# Patient Record
Sex: Female | Born: 1970 | Race: White | Hispanic: No | Marital: Married | Smoking: Never smoker
Health system: Southern US, Community
[De-identification: ages and names within clinical notes are randomized; demographics above are authoritative.]

## PROBLEM LIST (undated history)

## (undated) DIAGNOSIS — Z87440 Personal history of urinary (tract) infections: Secondary | ICD-10-CM

## (undated) DIAGNOSIS — N3946 Mixed incontinence: Secondary | ICD-10-CM

## (undated) DIAGNOSIS — G43909 Migraine, unspecified, not intractable, without status migrainosus: Secondary | ICD-10-CM

## (undated) DIAGNOSIS — K589 Irritable bowel syndrome without diarrhea: Secondary | ICD-10-CM

## (undated) DIAGNOSIS — Z87442 Personal history of urinary calculi: Secondary | ICD-10-CM

## (undated) DIAGNOSIS — E559 Vitamin D deficiency, unspecified: Secondary | ICD-10-CM

## (undated) DIAGNOSIS — E785 Hyperlipidemia, unspecified: Secondary | ICD-10-CM

## (undated) DIAGNOSIS — I8390 Asymptomatic varicose veins of unspecified lower extremity: Secondary | ICD-10-CM

## (undated) DIAGNOSIS — M1990 Unspecified osteoarthritis, unspecified site: Secondary | ICD-10-CM

## (undated) DIAGNOSIS — N3642 Intrinsic sphincter deficiency (ISD): Secondary | ICD-10-CM

## (undated) DIAGNOSIS — M6752 Plica syndrome, left knee: Secondary | ICD-10-CM

## (undated) DIAGNOSIS — F419 Anxiety disorder, unspecified: Secondary | ICD-10-CM

## (undated) DIAGNOSIS — N3281 Overactive bladder: Secondary | ICD-10-CM

## (undated) DIAGNOSIS — F329 Major depressive disorder, single episode, unspecified: Secondary | ICD-10-CM

## (undated) DIAGNOSIS — E042 Nontoxic multinodular goiter: Secondary | ICD-10-CM

## (undated) HISTORY — PX: AUGMENTATION MAMMAPLASTY: SUR837

## (undated) HISTORY — DX: Asymptomatic varicose veins of unspecified lower extremity: I83.90

## (undated) HISTORY — PX: OTHER SURGICAL HISTORY: SHX169

## (undated) HISTORY — DX: Hyperlipidemia, unspecified: E78.5

## (undated) HISTORY — DX: Anxiety disorder, unspecified: F41.9

## (undated) HISTORY — DX: Irritable bowel syndrome, unspecified: K58.9

## (undated) HISTORY — DX: Vitamin D deficiency, unspecified: E55.9

## (undated) HISTORY — DX: Major depressive disorder, single episode, unspecified: F32.9

## (undated) HISTORY — PX: THYROIDECTOMY: SHX17

## (undated) HISTORY — PX: BREAST ENHANCEMENT SURGERY: SHX7

---

## 1997-06-09 ENCOUNTER — Encounter: Admission: RE | Admit: 1997-06-09 | Discharge: 1997-06-09 | Payer: Self-pay | Admitting: Internal Medicine

## 2000-01-23 ENCOUNTER — Encounter: Payer: Self-pay | Admitting: Emergency Medicine

## 2000-01-23 ENCOUNTER — Emergency Department (HOSPITAL_COMMUNITY): Admission: EM | Admit: 2000-01-23 | Discharge: 2000-01-23 | Payer: Self-pay | Admitting: Emergency Medicine

## 2000-06-05 ENCOUNTER — Encounter: Admission: RE | Admit: 2000-06-05 | Discharge: 2000-06-12 | Payer: Self-pay | Admitting: Neurosurgery

## 2000-10-24 ENCOUNTER — Encounter
Admission: RE | Admit: 2000-10-24 | Discharge: 2000-11-22 | Payer: Self-pay | Admitting: Physical Medicine & Rehabilitation

## 2000-12-24 ENCOUNTER — Emergency Department (HOSPITAL_COMMUNITY): Admission: EM | Admit: 2000-12-24 | Discharge: 2000-12-24 | Payer: Self-pay | Admitting: Internal Medicine

## 2002-09-18 ENCOUNTER — Encounter: Payer: Self-pay | Admitting: Emergency Medicine

## 2002-09-18 ENCOUNTER — Emergency Department (HOSPITAL_COMMUNITY): Admission: EM | Admit: 2002-09-18 | Discharge: 2002-09-18 | Payer: Self-pay | Admitting: Emergency Medicine

## 2003-07-23 ENCOUNTER — Ambulatory Visit (HOSPITAL_COMMUNITY): Admission: RE | Admit: 2003-07-23 | Discharge: 2003-07-23 | Payer: Self-pay | Admitting: Internal Medicine

## 2004-01-08 ENCOUNTER — Emergency Department (HOSPITAL_COMMUNITY): Admission: EM | Admit: 2004-01-08 | Discharge: 2004-01-08 | Payer: Self-pay | Admitting: Emergency Medicine

## 2004-02-09 ENCOUNTER — Emergency Department (HOSPITAL_COMMUNITY): Admission: EM | Admit: 2004-02-09 | Discharge: 2004-02-09 | Payer: Self-pay | Admitting: Emergency Medicine

## 2004-05-12 ENCOUNTER — Ambulatory Visit: Payer: Self-pay | Admitting: Family Medicine

## 2004-07-23 ENCOUNTER — Emergency Department (HOSPITAL_COMMUNITY): Admission: EM | Admit: 2004-07-23 | Discharge: 2004-07-23 | Payer: Self-pay | Admitting: Emergency Medicine

## 2004-11-02 ENCOUNTER — Emergency Department (HOSPITAL_COMMUNITY): Admission: EM | Admit: 2004-11-02 | Discharge: 2004-11-02 | Payer: Self-pay | Admitting: Family Medicine

## 2004-11-02 ENCOUNTER — Ambulatory Visit (HOSPITAL_COMMUNITY): Admission: RE | Admit: 2004-11-02 | Discharge: 2004-11-02 | Payer: Self-pay | Admitting: Family Medicine

## 2004-11-16 ENCOUNTER — Emergency Department (HOSPITAL_COMMUNITY): Admission: EM | Admit: 2004-11-16 | Discharge: 2004-11-16 | Payer: Self-pay | Admitting: Family Medicine

## 2004-12-10 ENCOUNTER — Emergency Department (HOSPITAL_COMMUNITY): Admission: EM | Admit: 2004-12-10 | Discharge: 2004-12-10 | Payer: Self-pay | Admitting: Emergency Medicine

## 2005-07-06 ENCOUNTER — Emergency Department (HOSPITAL_COMMUNITY): Admission: EM | Admit: 2005-07-06 | Discharge: 2005-07-06 | Payer: Self-pay | Admitting: Emergency Medicine

## 2005-07-15 ENCOUNTER — Emergency Department (HOSPITAL_COMMUNITY): Admission: EM | Admit: 2005-07-15 | Discharge: 2005-07-15 | Payer: Self-pay | Admitting: Emergency Medicine

## 2005-07-18 ENCOUNTER — Emergency Department (HOSPITAL_COMMUNITY): Admission: EM | Admit: 2005-07-18 | Discharge: 2005-07-19 | Payer: Self-pay | Admitting: Emergency Medicine

## 2005-12-28 ENCOUNTER — Emergency Department (HOSPITAL_COMMUNITY): Admission: EM | Admit: 2005-12-28 | Discharge: 2005-12-28 | Payer: Self-pay | Admitting: Family Medicine

## 2008-01-25 HISTORY — PX: ABDOMINAL HYSTERECTOMY: SHX81

## 2008-05-07 ENCOUNTER — Emergency Department (HOSPITAL_COMMUNITY): Admission: EM | Admit: 2008-05-07 | Discharge: 2008-05-07 | Payer: Self-pay | Admitting: Emergency Medicine

## 2010-11-24 ENCOUNTER — Emergency Department (HOSPITAL_COMMUNITY)
Admission: EM | Admit: 2010-11-24 | Discharge: 2010-11-24 | Disposition: A | Discharge status: Home / Self Care | Payer: Self-pay | Attending: Emergency Medicine | Admitting: Emergency Medicine

## 2010-11-24 DIAGNOSIS — R110 Nausea: Secondary | ICD-10-CM | POA: Insufficient documentation

## 2010-11-24 DIAGNOSIS — R51 Headache: Secondary | ICD-10-CM | POA: Insufficient documentation

## 2010-11-24 DIAGNOSIS — M129 Arthropathy, unspecified: Secondary | ICD-10-CM | POA: Insufficient documentation

## 2010-11-24 DIAGNOSIS — N390 Urinary tract infection, site not specified: Secondary | ICD-10-CM | POA: Insufficient documentation

## 2010-11-24 DIAGNOSIS — Z79899 Other long term (current) drug therapy: Secondary | ICD-10-CM | POA: Insufficient documentation

## 2010-11-24 DIAGNOSIS — H53149 Visual discomfort, unspecified: Secondary | ICD-10-CM | POA: Insufficient documentation

## 2010-11-24 LAB — POCT PREGNANCY, URINE: Preg Test, Ur: NEGATIVE

## 2010-12-30 ENCOUNTER — Other Ambulatory Visit (HOSPITAL_COMMUNITY): Payer: Self-pay | Admitting: Family Medicine

## 2010-12-30 DIAGNOSIS — Z139 Encounter for screening, unspecified: Secondary | ICD-10-CM

## 2011-01-31 ENCOUNTER — Other Ambulatory Visit (HOSPITAL_COMMUNITY): Payer: Self-pay | Admitting: Family Medicine

## 2011-01-31 ENCOUNTER — Ambulatory Visit (HOSPITAL_COMMUNITY)
Admission: RE | Admit: 2011-01-31 | Discharge: 2011-01-31 | Disposition: A | Discharge status: Home / Self Care | Payer: PRIVATE HEALTH INSURANCE | Source: Ambulatory Visit | Attending: Family Medicine | Admitting: Family Medicine

## 2011-01-31 DIAGNOSIS — Z139 Encounter for screening, unspecified: Secondary | ICD-10-CM

## 2011-01-31 DIAGNOSIS — Z1231 Encounter for screening mammogram for malignant neoplasm of breast: Secondary | ICD-10-CM | POA: Insufficient documentation

## 2012-07-24 ENCOUNTER — Emergency Department (HOSPITAL_BASED_OUTPATIENT_CLINIC_OR_DEPARTMENT_OTHER)
Admission: EM | Admit: 2012-07-24 | Discharge: 2012-07-24 | Disposition: A | Discharge status: Home / Self Care | Payer: BC Managed Care – PPO | Attending: Emergency Medicine | Admitting: Emergency Medicine

## 2012-07-24 ENCOUNTER — Emergency Department (HOSPITAL_BASED_OUTPATIENT_CLINIC_OR_DEPARTMENT_OTHER): Payer: BC Managed Care – PPO

## 2012-07-24 ENCOUNTER — Encounter (HOSPITAL_BASED_OUTPATIENT_CLINIC_OR_DEPARTMENT_OTHER): Payer: Self-pay | Admitting: Family Medicine

## 2012-07-24 DIAGNOSIS — Z7982 Long term (current) use of aspirin: Secondary | ICD-10-CM | POA: Insufficient documentation

## 2012-07-24 DIAGNOSIS — G43909 Migraine, unspecified, not intractable, without status migrainosus: Secondary | ICD-10-CM | POA: Insufficient documentation

## 2012-07-24 DIAGNOSIS — R110 Nausea: Secondary | ICD-10-CM | POA: Insufficient documentation

## 2012-07-24 DIAGNOSIS — Z87440 Personal history of urinary (tract) infections: Secondary | ICD-10-CM | POA: Insufficient documentation

## 2012-07-24 MED ORDER — KETOROLAC TROMETHAMINE 30 MG/ML IJ SOLN
30.0000 mg | Freq: Once | INTRAMUSCULAR | Status: AC
  Administered 2012-07-24: 30 mg via INTRAVENOUS
  Filled 2012-07-24: qty 1

## 2012-07-24 MED ORDER — SODIUM CHLORIDE 0.9 % IV BOLUS (SEPSIS)
1000.0000 mL | Freq: Once | INTRAVENOUS | Status: AC
  Administered 2012-07-24: 1000 mL via INTRAVENOUS

## 2012-07-24 MED ORDER — DIPHENHYDRAMINE HCL 50 MG/ML IJ SOLN
25.0000 mg | Freq: Once | INTRAMUSCULAR | Status: AC
  Administered 2012-07-24: 25 mg via INTRAVENOUS
  Filled 2012-07-24: qty 1

## 2012-07-24 MED ORDER — ONDANSETRON HCL 4 MG/2ML IJ SOLN
4.0000 mg | Freq: Once | INTRAMUSCULAR | Status: AC
  Administered 2012-07-24: 4 mg via INTRAVENOUS
  Filled 2012-07-24: qty 2

## 2012-07-24 MED ORDER — METHYLPREDNISOLONE SODIUM SUCC 125 MG IJ SOLR
125.0000 mg | Freq: Once | INTRAMUSCULAR | Status: AC
  Administered 2012-07-24: 125 mg via INTRAVENOUS
  Filled 2012-07-24: qty 2

## 2012-07-24 NOTE — ED Notes (Signed)
Pt c/o headache x 2 days constant and no relief with excedrin migraine and fiorcet. Pt reports h/o headaches and has not seen neuro for "years".

## 2012-07-24 NOTE — ED Notes (Signed)
MD at bedside. 

## 2012-07-24 NOTE — ED Notes (Signed)
Pt verbalizing ready for dispo, md notified.

## 2012-07-24 NOTE — ED Provider Notes (Signed)
   History    CSN: [id number] Arrival date & time 07/24/12  1042  First MD Initiated Contact with Patient 07/24/12 1140     Chief Complaint  Patient presents with  . Headache   (Consider location/radiation/quality/duration/timing/severity/associated sxs/prior Treatment) HPI  42 y.o. Female with history of migraines presents with headache for a week began gradually.  Pain is in left front of head-left forehead.  Pain is nonradiating and aching.  Pain rated at 10.  Patient has taken two excedrin migraine without relief today.  She has taken ibuprofen, excedrin, hydrocodone last night.  Denies recent head injury, fever, chills, vision changes, no vomiting,numbness, weakness, confusion, sick contacts.   Patient states she has had nausea.  PMD Dr. [address] Past Medical History  Diagnosis Date  . Headache   . Frequent UTI    Past Surgical History  Procedure Laterality Date  . Cesarean section    . Breast implants    . Abdominal hysterectomy     No family history on file. History  Substance Use Topics  . Smoking status: Never Smoker   . Smokeless tobacco: Not on file  . Alcohol Use: No   OB History   Grav Para Term Preterm Abortions TAB SAB Ect Mult Living                 Review of Systems  All other systems reviewed and are negative.    Allergies  Nitrofuran derivatives  Home Medications   Current Outpatient Rx  Name  Route  Sig  Dispense  Refill  . aspirin-acetaminophen-caffeine (EXCEDRIN MIGRAINE) 250-250-65 MG per tablet   Oral   Take 1 tablet by mouth every 6 (six) hours as needed for pain.          BP 99/68  Pulse 86  Temp(Src) 98.3 F (36.8 C) (Oral)  Resp 22  Ht 5\' 4"  (1.626 m)  Wt 172 lb (78.019 kg)  BMI 29.51 kg/m2  SpO2 98% Physical Exam  Nursing note and vitals reviewed. Constitutional: She is oriented to person, place, and time. She appears well-developed and well-nourished.  HENT:  Head: Normocephalic and atraumatic.  Right  Ear: External ear normal.  Left Ear: External ear normal.  Nose: Nose normal.  Mouth/Throat: Oropharynx is clear and moist.  Eyes: Conjunctivae and EOM are normal. Pupils are equal, round, and reactive to light.  Neck: Normal range of motion. Neck supple.  Cardiovascular: Normal rate, regular rhythm, normal heart sounds and intact distal pulses.   Pulmonary/Chest: Effort normal and breath sounds normal.  Abdominal: Soft. Bowel sounds are normal.  Musculoskeletal: Normal range of motion.  Neurological: She is alert and oriented to person, place, and time. She has normal reflexes.  Skin: Skin is warm and dry.  Psychiatric: She has a normal mood and affect. Her behavior is normal. Thought content normal.    ED Course  Procedures (including critical care time) Labs Reviewed - No data to display No results found. No diagnosis found.  MDM  Patient feels improved.    Pt HA treated and improved while in ED.  Presentation is like pts typical HA and non concerning for [facility], I[facility], Meningitis, or temporal arteritis. Pt is afebrile with no focal neuro deficits, nuchal rigidity, or change in vision. Pt is to follow up with PCP to discuss prophylactic medication. Pt verbalizes understanding and is agreeable with plan to dc.    Hilario Quarry, MD 07/24/12 [phone]

## 2012-07-24 NOTE — ED Notes (Signed)
Patient transported to CT via stretcher per tech. 

## 2012-08-22 ENCOUNTER — Ambulatory Visit (INDEPENDENT_AMBULATORY_CARE_PROVIDER_SITE_OTHER): Payer: BC Managed Care – PPO | Admitting: Diagnostic Neuroimaging

## 2012-08-22 ENCOUNTER — Encounter: Payer: Self-pay | Admitting: Diagnostic Neuroimaging

## 2012-08-22 VITALS — BP 101/70 | HR 78 | Ht 64.0 in | Wt 172.0 lb

## 2012-08-22 DIAGNOSIS — G43009 Migraine without aura, not intractable, without status migrainosus: Secondary | ICD-10-CM

## 2012-08-22 MED ORDER — AMITRIPTYLINE HCL 25 MG PO TABS: 25.0000 mg | ORAL_TABLET | Freq: Every day | ORAL | Status: DC

## 2012-08-22 MED ORDER — RIZATRIPTAN BENZOATE 10 MG PO TBDP: 10.0000 mg | ORAL_TABLET | ORAL | Status: DC | PRN

## 2012-08-22 NOTE — Progress Notes (Signed)
[facility]  PATIENT: Dawn Rojas DOB: 04-30-1970  REFERRING CLINICIAN: [facility] / Carlynn Purl HISTORY FROM: patient REASON FOR VISIT: new consult   HISTORICAL  CHIEF COMPLAINT:  Chief Complaint  Patient presents with  . Headache    Np# 6    HISTORY OF PRESENT ILLNESS:   42 year old right-handed female with migraine and anxiety here for valuation of headaches.  In age 24s, patient developed headaches, triggered by alcohol use, with nausea, dizziness, photophobia and phonophobia. Headaches subsided on their own. Since past 3 months she has had increasing headaches. Headaches are right frontal region similar to previous headaches. She was prescribed Imitrex 25 mg and 30 mg without relief. She's having some blurred vision, hearing buzzing sounds with a headaches. She's had 10-12 days of severe headache in the month of July.  Her past 3 months she's been taking increasing amounts of Excedrin migraine headache medication, 2-8 tablets per day.  REVIEW OF SYSTEMS: Full 14 system review of systems performed and notable only for weight gain fatigue ringing in his blurred vision aching muscles anxiety none of sleep decreased energy restless legs headache.  ALLERGIES: Allergies  Allergen Reactions  . Nitrofuran Derivatives     HOME MEDICATIONS: Outpatient Prescriptions Prior to Visit  Medication Sig Dispense Refill  . aspirin-acetaminophen-caffeine (EXCEDRIN MIGRAINE) 250-250-65 MG per tablet Take 1 tablet by mouth every 6 (six) hours as needed for pain.       No facility-administered medications prior to visit.    PAST MEDICAL HISTORY: Past Medical History  Diagnosis Date  . Headache   . Frequent UTI   . Anxiety     PAST SURGICAL HISTORY: Past Surgical History  Procedure Laterality Date  . Cesarean section    . Breast implants    . Abdominal hysterectomy      FAMILY HISTORY: Family History  Problem Relation Age of Onset  . Cancer  Maternal Grandmother   . Cancer Paternal Grandfather     SOCIAL HISTORY:  History   Social History  . Marital Status: Divorced    Spouse Name: N/A    Number of Children: 4  . Years of Education: ged   Occupational History  . clean houses    Social History Main Topics  . Smoking status: Never Smoker   . Smokeless tobacco: Not on file  . Alcohol Use: No  . Drug Use: No  . Sexually Active: Not on file   Other Topics Concern  . Not on file   Social History Narrative  . No narrative on file     PHYSICAL EXAM  Filed Vitals:   08/22/12 0859  BP: 101/70  Pulse: 78  Height: 5\' 4"  (1.626 m)  Weight: 172 lb (78.019 kg)    Not recorded    Body mass index is 29.51 kg/(m^2).  GENERAL EXAM: Patient is in no distress  CARDIOVASCULAR: Regular rate and rhythm, no murmurs, no carotid bruits  NEUROLOGIC: MENTAL STATUS: awake, alert, language fluent, comprehension intact, naming intact CRANIAL NERVE: no papilledema on fundoscopic exam, pupils equal and reactive to light, visual fields full to confrontation, extraocular muscles intact, no nystagmus, facial sensation and strength symmetric, uvula midline, shoulder shrug symmetric, tongue midline. MOTOR: normal bulk and tone, full strength in the BUE, BLE SENSORY: normal and symmetric to light touch, pinprick, temperature, vibration and proprioception COORDINATION: finger-nose-finger, fine finger movements normal REFLEXES: deep tendon reflexes present and symmetric GAIT/STATION: narrow based gait; able to walk on toes, heels and tandem; romberg  is negative   DIAGNOSTIC DATA (LABS, IMAGING, TESTING) - I reviewed patient records, labs, notes, testing and imaging myself where available.  No results found for this basename: WBC, HGB, HCT, MCV, PLT   No results found for this basename: na, k, cl, co2, glucose, bun, creatinine, calcium, prot, albumin, ast, alt, alkphos, bilitot, gfrnonaa, gfraa   No results found for this  basename: CHOL, HDL, LDLCALC, LDLDIRECT, TRIG, CHOLHDL   No results found for this basename: HGBA1C   No results found for this basename: VITAMINB12   No results found for this basename: TSH    07/24/12 CT head - normal   ASSESSMENT AND PLAN  42 y.o. year old female  has a past medical history of Headache; Frequent UTI; and Anxiety. here with increasing headaches over past 3 months, likely migraine headache. Neurologic examination unremarkable. CT head normal.  PLAN: 1. Amitriptyline for migraine prophylaxis 2. rizatriptan prn breakthrough migraine 3. Educated patient on headache triggers, analgesic overuse headaches, headache diary 4. Patient instructed to taper Excedrin migraine usage    Suanne Marker, MD 08/22/2012, 10:02 AM Certified in Neurology, Neurophysiology and Neuroimaging  [facility] [address] [place], [place] [address] [phone]

## 2012-08-22 NOTE — Patient Instructions (Signed)
Start amitriptyline 25mg  at bedtime; can increase to 50mg  at bedtime after 1-2 weeks.  Start rizatriptan as needed for breakthrough migraines, instead of imitrex.

## 2012-09-12 ENCOUNTER — Telehealth: Payer: Self-pay | Admitting: Diagnostic Neuroimaging

## 2012-09-13 MED ORDER — AMITRIPTYLINE HCL 25 MG PO TABS: 25.0000 mg | ORAL_TABLET | Freq: Every day | ORAL | Status: DC

## 2012-09-13 NOTE — Telephone Encounter (Signed)
Rx for Amitriptyline was already sent to the pharmacy last month for current dose.  I have resent Rx.

## 2012-11-21 ENCOUNTER — Ambulatory Visit (INDEPENDENT_AMBULATORY_CARE_PROVIDER_SITE_OTHER): Payer: BC Managed Care – PPO | Admitting: Nurse Practitioner

## 2012-11-21 ENCOUNTER — Encounter (INDEPENDENT_AMBULATORY_CARE_PROVIDER_SITE_OTHER): Payer: Self-pay

## 2012-11-21 ENCOUNTER — Encounter: Payer: Self-pay | Admitting: Nurse Practitioner

## 2012-11-21 VITALS — BP 99/65 | HR 90 | Temp 98.2°F | Ht 63.75 in | Wt 173.0 lb

## 2012-11-21 DIAGNOSIS — G43009 Migraine without aura, not intractable, without status migrainosus: Secondary | ICD-10-CM

## 2012-11-21 DIAGNOSIS — F419 Anxiety disorder, unspecified: Secondary | ICD-10-CM

## 2012-11-21 DIAGNOSIS — IMO0001 Reserved for inherently not codable concepts without codable children: Secondary | ICD-10-CM

## 2012-11-21 DIAGNOSIS — M791 Myalgia, unspecified site: Secondary | ICD-10-CM

## 2012-11-21 DIAGNOSIS — F411 Generalized anxiety disorder: Secondary | ICD-10-CM

## 2012-11-21 MED ORDER — CAMBIA 50 MG PO PACK: 1.0000 | PACK | Freq: Four times a day (QID) | ORAL | Status: DC | PRN

## 2012-11-21 MED ORDER — TRAMADOL HCL 50 MG PO TABS: 100.0000 mg | ORAL_TABLET | Freq: Four times a day (QID) | ORAL | Status: DC | PRN

## 2012-11-21 MED ORDER — PROPRANOLOL HCL ER 60 MG PO CP24: 60.0000 mg | ORAL_CAPSULE | Freq: Every day | ORAL | Status: DC

## 2012-11-21 NOTE — Progress Notes (Signed)
[facility]  PATIENT: Dawn Rojas DOB: Dec 29, 1970   REASON FOR VISIT: follow up HISTORY FROM: patient  HISTORY OF PRESENT ILLNESS: UPDATE 11/21/12 (LL): Dawn Rojas comes to office for headache revisit, she states that her headaches are not any better than last visit.  HIT-6 score is 57.  She has a headache at least 5 days out of 7.  Her headache today is 4/10.  Headaches have a tight "squeezing" feeling. Sometimes has nausea, but no vomiting. Positive for photophobia.  She has lost her father and 7 other family members within the last 3 months, and has high level of stress all the time due to having 6 children and limited budget. She did not have health insurance for over 10 years and has not had routine health maintenance.  Now with insurance, she is trying to "catch up" on her health issues.  She has been on topamax in the past, perscribed with phentermine for weight loss, and had stroke symptoms and was hospitalized. Headaches are varied in term of times of day they occur. She has not seen any benefit with amitriptyline at 50 mg dose, she is not getting much relief from Maxalt either.    She states she has had nighttime severe muscle pain in her legs intermittently for over 15 years; she was told she has "a muscle disease from waist down" and that it is restless legs, although of note she does not feel like she must move her legs or that they are "crawling".  She has had a remote EMG/NCV, but she does not know what the results were.  She was put in Requip in the past, but did not get much benefit.  08/22/12 (VRP): 42 year old right-handed female with migraine and anxiety here for valuation of headaches.  In age 20s, patient developed headaches, triggered by alcohol use, with nausea, dizziness, photophobia and phonophobia. Headaches subsided on their own. Since past 3 months she has had increasing headaches. Headaches are right frontal region similar to previous headaches. She  was prescribed Imitrex 25 mg and 30 mg without relief. She's having some blurred vision, hearing buzzing sounds with a headaches. She's had 10-12 days of severe headache in the month of July.  Her past 3 months she's been taking increasing amounts of Excedrin migraine headache medication, 2-8 tablets per day.   REVIEW OF SYSTEMS: Full 14 system review of systems performed and notable only for weight gain fatigue ringing in ears, blurred vision,  aching muscles, anxiety, not enough sleep, decreased energy, restless legs, headache.   ALLERGIES: Allergies  Allergen Reactions  . Nitrofuran Derivatives     HOME MEDICATIONS: Outpatient Prescriptions Prior to Visit  Medication Sig Dispense Refill  . aspirin-acetaminophen-caffeine (EXCEDRIN MIGRAINE) 250-250-65 MG per tablet Take 1 tablet by mouth every 6 (six) hours as needed for pain.      . rizatriptan (MAXALT-MLT) 10 MG disintegrating tablet Take 1 tablet (10 mg total) by mouth as needed for migraine. May repeat in 2 hours if needed  9 tablet  11  . amitriptyline (ELAVIL) 25 MG tablet Take 1 tablet (25 mg total) by mouth at bedtime.  90 tablet  1   No facility-administered medications prior to visit.    PAST MEDICAL HISTORY: Past Medical History  Diagnosis Date  . Headache   . Frequent UTI   . Anxiety     PAST SURGICAL HISTORY: Past Surgical History  Procedure Laterality Date  . Cesarean section    . Breast implants    .  Abdominal hysterectomy      FAMILY HISTORY: Family History  Problem Relation Age of Onset  . Cancer Maternal Grandmother   . Cancer Paternal Grandfather     SOCIAL HISTORY: History   Social History  . Marital Status: Divorced    Spouse Name: N/A    Number of Children: 4  . Years of Education: ged   Occupational History  . clean houses    Social History Main Topics  . Smoking status: Never Smoker   . Smokeless tobacco: Not on file  . Alcohol Use: No  . Drug Use: No  . Sexual Activity: Yes    Other Topics Concern  . Not on file   Social History Narrative  . No narrative on file     PHYSICAL EXAM  Filed Vitals:   11/21/12 0859  BP: 99/65  Pulse: 90  Temp: 98.2 F (36.8 C)  TempSrc: Oral  Height: 5' 3.75" (1.619 m)  Weight: 173 lb (78.472 kg)   Body mass index is 29.94 kg/(m^2).  Generalized: Well developed, in no acute distress  Head: normocephalic and atraumatic. Oropharynx benign  Neck: Supple, no carotid bruits  Cardiac: Regular rate rhythm, no murmur  Musculoskeletal: No deformity   NEUROLOGIC:  MENTAL STATUS: awake, alert, language fluent, comprehension intact, naming intact  CRANIAL NERVE:  pupils equal and reactive to light, visual fields full to confrontation, extraocular muscles intact, no nystagmus, facial sensation and strength symmetric, uvula midline, shoulder shrug symmetric, tongue midline.  MOTOR: normal bulk and tone, full strength in the BUE, BLE  SENSORY: normal and symmetric to light touch, pinprick, temperature, vibration and proprioception  COORDINATION: finger-nose-finger, fine finger movements normal  REFLEXES: deep tendon reflexes present and symmetric  GAIT/STATION: narrow based gait; able to walk on toes, heels and tandem; romberg is negative  DIAGNOSTIC DATA (LABS, IMAGING, TESTING) - I reviewed patient records, labs, notes, testing and imaging myself where available.  07/24/12 CT head - normal   ASSESSMENT AND PLAN 42 y.o. year old female has a past medical history of Headache; Frequent UTI; and Anxiety. here with increasing headaches over past 3 months, likely migraine headache with mixed tension component. Neurologic examination unremarkable. CT head normal.  I am going to switch her prophylaxis treatment to Propranolol.  She has had no benefit from amitriptyline, and has failed topamax in the past.  If no relief with Propanolol at next visit, I will consider SSRI or SNRI, considering her high stress level and history of  anxiety.  Nighttime muscle pain:   PLAN:  1. Discontinue Amitriptyline for migraine prophylaxis. Wean by taking 25 mg x 3 days then stop. 2. Start Propranolol ER 60 mg daily for migraine prophylaxis. 3. Continue rizatriptan prn breakthrough migraine.  4. Try Cambia powders for breakthrough migraine, 3 samples given, Rx given. 5. Check labs: Hgba1c, ANA w/ reflex, CK, LFTs, Anemia Panel, CMP for leg pain. 6. EMG/NCV lower extremities for leg pain/restless legs 7. Start Tramadol 50 mg, 1-2 tablets every 6 hours as needed for headache or leg pain. 8. Educated patient on headache triggers, analgesic overuse headaches, headache diary  9. Patient instructed to minimize Excedrin migraine usage 10. Follow up in 2 months.   Orders Placed This Encounter  Procedures  . Hemoglobin A1c  . ANA w/Reflex if Positive  . Anemia Profile B  . CK    Meds ordered this encounter  Medications  . propranolol ER (INDERAL LA) 60 MG 24 hr capsule    Sig: Take 1  capsule (60 mg total) by mouth daily.    Dispense:  30 capsule    Refill:  1    Order Specific Question:  Supervising Provider    Answer:  Joycelyn Schmid R [[id number]]  . CAMBIA 50 MG PACK    Sig: Take 1 each by mouth every 6 (six) hours as needed.    Dispense:  1 each    Refill:  3    Order Specific Question:  Supervising Provider    Answer:  Joycelyn Schmid R [[id number]]  . traMADol (ULTRAM) 50 MG tablet    Sig: Take 2 tablets (100 mg total) by mouth every 6 (six) hours as needed for pain.    Dispense:  90 tablet    Refill:  2    Order Specific Question:  Supervising Provider    Answer:  Joycelyn Schmid R [[id number]]    Ronal Fear, MSN, NP-C 11/21/2012, 10:18 AM [facility] [address], [address] [place], [place] [address] [phone]

## 2012-11-21 NOTE — Patient Instructions (Addendum)
Wean off amitriptyline, take 25 mg daily for 3 days then stop.  Start propranolol ER 60 mg daily for headache prevention.  Rx sent to [facility].  Tramadol 50 -100 mg every 8 hours as needed for headache.  Try to minimize Excedrin migraine.  Samples given for Cambia powders, mix ONLY with water, if helpful, fill Rx with discount card.  We are checking labs today, and will schedule EMG/NCV. Someone will call you to schedule this.  Follow up in 2 months.

## 2012-11-30 ENCOUNTER — Telehealth: Payer: Self-pay | Admitting: Diagnostic Neuroimaging

## 2012-12-04 ENCOUNTER — Ambulatory Visit (INDEPENDENT_AMBULATORY_CARE_PROVIDER_SITE_OTHER): Payer: BC Managed Care – PPO | Admitting: *Deleted

## 2012-12-04 DIAGNOSIS — G43909 Migraine, unspecified, not intractable, without status migrainosus: Secondary | ICD-10-CM

## 2012-12-04 MED ORDER — VALPROATE SODIUM 500 MG/5ML IV SOLN
1000.0000 mg | INTRAVENOUS | Status: DC
  Administered 2012-12-04: 1000 mg via INTRAVENOUS

## 2012-12-04 NOTE — Patient Instructions (Signed)
Will come in tomorrow for lab draw.

## 2012-12-04 NOTE — Telephone Encounter (Signed)
I called and LMVM for pt that Dr. Marjory Lies consulted.  May try depacon infusion and phenergan for n/v.  If takes phenergan will need driver.  I informed her that this can be done up until 1530 today, then it would be tomorrow for the infusion in message.

## 2012-12-04 NOTE — Telephone Encounter (Signed)
Pt called back and is ok to come in  for depacon infusion.  (1000mg  IV) for her migraine.  No phenergan at this time due to coming in between jobs.

## 2012-12-04 NOTE — Progress Notes (Signed)
Pt here for Depacon infusion.  VS 96/64, 97.6 oral, 66 pulse.  At 1549  Headache level 6, R side/back of head.  Has taken excedrin migraine today (2) tabs, Took tramadol 50mg  tabs (total 6 tabs yesterday).  IV to L AC 24g angiocath inserted with good blood return, taped securely.   Pt made comfortable, lights dimmed.  She had come from work (cleaning homes).  Cambia samples given (4) instructed to take at onset headache , repeat in 2 hours if needed (only take 2 in 24 hour period).  At 1600 depacon 1000mg  IV done, 20mg  NS flush given, Level down to 2-3.  At 1606 Level to 1.  (mild).  Good relief.  IV discontinued, pressure applied, bandaid applied.   Accompanied to check out.  Pt to come by tomorrow for lab draw. Esmond Camper in pharmacy notified about changing cambia in system for pharmacy to proceed with prescription.

## 2012-12-04 NOTE — Telephone Encounter (Signed)
Pt called and is asking for different medications.  She is now taking propranolol daily, has tried cambia, tramadol, maxalt, execedrin and nothing has gotten rid of this headache.  Level 07-31-08 at worst.  Headache has lasted for the last 8 days.  Vomiting yesterday.  [phone].  LLam, NP out. Please advise.

## 2012-12-05 ENCOUNTER — Telehealth: Payer: Self-pay | Admitting: *Deleted

## 2012-12-05 ENCOUNTER — Other Ambulatory Visit (INDEPENDENT_AMBULATORY_CARE_PROVIDER_SITE_OTHER): Payer: Self-pay

## 2012-12-05 DIAGNOSIS — Z0289 Encounter for other administrative examinations: Secondary | ICD-10-CM

## 2012-12-06 LAB — COMPREHENSIVE METABOLIC PANEL
ALT: 29 IU/L (ref 0–32)
AST: 25 IU/L (ref 0–40)
Albumin/Globulin Ratio: 2 (ref 1.1–2.5)
Albumin: 4.3 g/dL (ref 3.5–5.5)
Alkaline Phosphatase: 62 IU/L (ref 39–117)
BUN/Creatinine Ratio: 17 (ref 9–23)
BUN: 14 mg/dL (ref 6–24)
CO2: 23 mmol/L (ref 18–29)
Calcium: 8.7 mg/dL (ref 8.7–10.2)
Chloride: 102 mmol/L (ref 97–108)
Creatinine, Ser: 0.81 mg/dL (ref 0.57–1.00)
GFR calc Af Amer: 104 mL/min/{1.73_m2} (ref >59)
GFR calc non Af Amer: 90 mL/min/{1.73_m2} (ref >59)
Globulin, Total: 2.1 g/dL (ref 1.5–4.5)
Glucose: 61 mg/dL — ABNORMAL LOW (ref 65–99)
Potassium: 4.4 mmol/L (ref 3.5–5.2)
Sodium: 141 mmol/L (ref 134–144)
Total Bilirubin: 0.4 mg/dL (ref 0.0–1.2)
Total Protein: 6.4 g/dL (ref 6.0–8.5)

## 2012-12-06 LAB — ANEMIA PROFILE B
Basophils Absolute: 0 10*3/uL (ref 0.0–0.2)
Basos: 1 %
Eos: 1 %
Eosinophils Absolute: 0.1 10*3/uL (ref 0.0–0.4)
Ferritin: 54 ng/mL (ref 15–150)
Folate: 14.2 ng/mL (ref >3.0)
HCT: 44.7 % (ref 34.0–46.6)
Hemoglobin: 14.9 g/dL (ref 11.1–15.9)
Immature Grans (Abs): 0 10*3/uL (ref 0.0–0.1)
Immature Granulocytes: 0 %
Iron Saturation: 25 % (ref 15–55)
Iron: 73 ug/dL (ref 35–155)
Lymphocytes Absolute: 2.3 10*3/uL (ref 0.7–3.1)
Lymphs: 43 %
MCH: 30.3 pg (ref 26.6–33.0)
MCHC: 33.3 g/dL (ref 31.5–35.7)
MCV: 91 fL (ref 79–97)
Monocytes Absolute: 0.6 10*3/uL (ref 0.1–0.9)
Monocytes: 12 %
Neutrophils Absolute: 2.3 10*3/uL (ref 1.4–7.0)
Neutrophils Relative %: 43 %
Platelets: 231 10*3/uL (ref 150–379)
RBC: 4.91 x10E6/uL (ref 3.77–5.28)
RDW: 13.8 % (ref 12.3–15.4)
Retic Ct Pct: 1 % (ref 0.6–2.6)
TIBC: 297 ug/dL (ref 250–450)
UIBC: 224 ug/dL (ref 150–375)
Vitamin B-12: 565 pg/mL (ref 211–946)
WBC: 5.3 10*3/uL (ref 3.4–10.8)

## 2012-12-06 LAB — CK: Total CK: 92 U/L (ref 24–173)

## 2012-12-06 LAB — HEPATIC FUNCTION PANEL: Bilirubin, Direct: 0.12 mg/dL (ref 0.00–0.40)

## 2012-12-06 LAB — HEMOGLOBIN A1C
Est. average glucose Bld gHb Est-mCnc: 120 mg/dL
Hgb A1c MFr Bld: 5.8 % — ABNORMAL HIGH (ref 4.8–5.6)

## 2012-12-06 LAB — ANA W/REFLEX IF POSITIVE: Anti Nuclear Antibody(ANA): NEGATIVE

## 2012-12-06 NOTE — Telephone Encounter (Signed)
Spoke with patient and told her will call when order has been scheduled

## 2012-12-07 NOTE — Progress Notes (Signed)
Quick Note:  Shared normal labs with patient per L Lam 's findings, patient wants to know what to do to keep A1c under control ______

## 2012-12-10 LAB — SPECIMEN STATUS REPORT

## 2012-12-11 ENCOUNTER — Other Ambulatory Visit: Payer: Self-pay | Admitting: Nurse Practitioner

## 2012-12-11 DIAGNOSIS — G2581 Restless legs syndrome: Secondary | ICD-10-CM

## 2012-12-11 NOTE — Telephone Encounter (Signed)
Order has been placed by LL, front check-out will sched, informed patient

## 2012-12-11 NOTE — Progress Notes (Signed)
Quick Note:  Shared information with patient per Ms Lams advice, she understood ______

## 2012-12-26 ENCOUNTER — Ambulatory Visit (INDEPENDENT_AMBULATORY_CARE_PROVIDER_SITE_OTHER): Payer: BC Managed Care – PPO | Admitting: Diagnostic Neuroimaging

## 2012-12-26 ENCOUNTER — Encounter (INDEPENDENT_AMBULATORY_CARE_PROVIDER_SITE_OTHER): Payer: Self-pay | Admitting: Radiology

## 2012-12-26 DIAGNOSIS — G2581 Restless legs syndrome: Secondary | ICD-10-CM

## 2012-12-26 DIAGNOSIS — Z0289 Encounter for other administrative examinations: Secondary | ICD-10-CM

## 2012-12-26 NOTE — Procedures (Signed)
   [facility]  NCS (NERVE CONDUCTION STUDY) WITH EMG (ELECTROMYOGRAPHY) REPORT   STUDY DATE: 12/26/12 PATIENT NAME: Dawn Rojas DOB: 04-10-1970 MRN: [id number]  ORDERING CLINICIAN: Heide Guile, NP  TECHNOLOGIST: Kaylyn Lim ELECTROMYOGRAPHER: Glenford Bayley. Penumalli, MD  CLINICAL INFORMATION: 42 year old female with restless leg syndrome.  FINDINGS: NERVE CONDUCTION STUDY: Bilateral peroneal and tibial motor responses have normal distal latencies, amplitudes, conduction velocities and F-wave latencies. Bilateral sural sensory responses are normal.  NEEDLE ELECTROMYOGRAPHY: Needle examination of right vastus medialis, tibialis anterior, gastrocnemius shows no abnormal spontaneous activity at rest and normal motor unit recruitment on exertion.  IMPRESSION:  This is a normal study. No electrodiagnostic evidence of large fiber neuropathy at this time.   INTERPRETING PHYSICIAN:  Suanne Marker, MD Certified in Neurology, Neurophysiology and Neuroimaging  [facility] [address] [place], [place] [address] [phone]

## 2013-01-02 NOTE — Telephone Encounter (Signed)
This encounter was created in error - please disregard.

## 2013-01-03 NOTE — Progress Notes (Signed)
I reviewed note and agree with plan.   VIKRAM R. PENUMALLI, MD  Certified in Neurology, Neurophysiology and Neuroimaging  [facility] [address], [address] [place], [place] [address] [phone]   

## 2013-01-31 ENCOUNTER — Telehealth: Payer: Self-pay | Admitting: Nurse Practitioner

## 2013-01-31 DIAGNOSIS — G43009 Migraine without aura, not intractable, without status migrainosus: Secondary | ICD-10-CM

## 2013-02-01 MED ORDER — PREDNISONE (PAK) 10 MG PO TABS: ORAL_TABLET | Freq: Every day | ORAL | Status: DC

## 2013-02-01 NOTE — Telephone Encounter (Signed)
Do you have availability for this patient to come in for Depacon infusion this afternoon?  If so, please call her to come in.

## 2013-02-01 NOTE — Telephone Encounter (Signed)
I called pt and offered her the Depacon infusion.  She stated that she did not have good results from the infusion.   I relayed from the note done, by myself, that she started with level 6 and went to level 1.  She stated that something needed to be done, she cannot go on like this.   I told her that Heide GuileLynn Lam, NP offered her medrol dose pack and to see if this would help. Use [facility] on [address].  Please advise.  Relayed common SE, increased energy, increased appetite, insomnia, but there are others.  Call back if needed.

## 2013-02-01 NOTE — Telephone Encounter (Signed)
RN  Andrey CampanileSandy spoke with patient and offered to come in for Depacon infusion and patient declined, saying she did not good response to this in the past.  I have sent in a Prednisone Dose Pack to [facility] to see if this can break up the Migraine.  Andrey CampanileSandy went over the side effects with her by phone.

## 2013-02-27 ENCOUNTER — Telehealth: Payer: Self-pay | Admitting: Nurse Practitioner

## 2013-02-27 ENCOUNTER — Ambulatory Visit: Payer: BC Managed Care – PPO | Admitting: Nurse Practitioner

## 2013-02-27 NOTE — Telephone Encounter (Signed)
Patient was no show for today's office appointment.  

## 2013-11-08 ENCOUNTER — Other Ambulatory Visit: Payer: Self-pay | Admitting: Urology

## 2013-11-11 ENCOUNTER — Other Ambulatory Visit: Payer: Self-pay | Admitting: Urology

## 2013-11-15 ENCOUNTER — Encounter (HOSPITAL_BASED_OUTPATIENT_CLINIC_OR_DEPARTMENT_OTHER): Payer: Self-pay | Admitting: *Deleted

## 2013-11-19 ENCOUNTER — Encounter (HOSPITAL_BASED_OUTPATIENT_CLINIC_OR_DEPARTMENT_OTHER): Payer: Self-pay | Admitting: *Deleted

## 2013-11-19 NOTE — Progress Notes (Signed)
[facility]Hg on arrival-Instructed NPO after Mn-.

## 2013-11-20 ENCOUNTER — Encounter (HOSPITAL_BASED_OUTPATIENT_CLINIC_OR_DEPARTMENT_OTHER): Payer: Self-pay | Admitting: Anesthesiology

## 2013-11-20 NOTE — H&P (Signed)
Active Problems Problems  1. Cystocele, midline (N81.11) 2. 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 Urge and stress incontinence (N39.46)  History of Present Illness Dawn Rojas returns today in f/u. 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 She was last given Myrbetriq which didn't help. She had previously been given Vesicare which seemed to help some. The incontinence is getting worse. She doesn't wear pads because of skin irritation.  She has no dysuria.   Past Medical History Problems  1. History of Anxiety (F41.9) 2. History of arthritis (Z87.39) 3. History of renal calculi (Z87.442) 4. History of urinary tract infection (Z87.440)  Surgical History Problems  1. History of Abdominoplasty 2. History of Breast Surgery Enlargement Procedure Elective 3. History of Dilation Of Female Urethra 4. History of Liposuction 5. History of Vaginal Hysterectomy  Current Meds 1. Ibuprofen 800 MG Oral Tablet;  Therapy: (Recorded:24Jun2015) to Recorded 2. Phentermine HCl - 37.5 MG Oral Tablet;  Therapy: (Recorded:01Jul2015) to Recorded  Allergies Medication  1. Levsin 2. Macrodantin CAPS  Family History Problems  1. Family history of hypertension (Z82.49)  Social History Problems  1. Alcohol use (F10.99)   rarely 2. Caffeine use (F15.90)   3-4 a day 3. Divorced 4. Never a smoker 5. Occupation   cleans houses  Past and social history reviewed and updated.   Review of Systems Genitourinary, constitutional, skin, eye, otolaryngeal, hematologic/lymphatic, cardiovascular, pulmonary, endocrine, musculoskeletal, gastrointestinal, neurological and psychiatric system(s) were reviewed and pertinent findings if present are noted.  Genitourinary: incontinence.    Vitals Vital Signs [Data Includes: Last 1 Day]  Recorded: 14Oct2015 09:51AM  Blood Pressure: 100 / 71 Temperature: 97.2 F Heart Rate: 84  Physical Exam Constitutional: Well  nourished and well developed . No acute distress.  Pulmonary: No respiratory distress and normal respiratory rhythm and effort.  Cardiovascular: Heart rate and rhythm are normal . No peripheral edema.    Results/Data Urine [Data Includes: Last 1 Day]   14Oct2015  COLOR YELLOW   APPEARANCE CLEAR   SPECIFIC GRAVITY 1.015   pH 5.5   GLUCOSE NEG mg/dL  BILIRUBIN NEG   KETONE NEG mg/dL  BLOOD NEG   PROTEIN NEG mg/dL  UROBILINOGEN 0.2 mg/dL  NITRITE NEG   LEUKOCYTE ESTERASE SMALL   SQUAMOUS EPITHELIAL/HPF MODERATE   WBC 0-2 WBC/hpf  RBC 0-2 RBC/hpf  BACTERIA FEW   CRYSTALS NONE SEEN   CASTS Hyaline casts noted    The following clinical lab reports were reviewed:  UA reviewed.    Assessment Assessed  1. 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 Urge and stress incontinence (N39.46) 3. 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   Plan Health Maintenance  1. UA With REFLEX; [Do Not Release]; Status:Resulted - Requires Verification;   Done:  14Oct2015 09:43AM Intrinsic sphincter deficiency, Urge and stress incontinence  2. Follow-up Schedule Surgery Office  Follow-up  Status: Hold For - Appointment   Requested for: 14Oct2015  I discussed the options for treatment with her including neuromodulation, PT ( which has tried before without success), bulking agents and a mid urethral sling.  She is interested in having the sling done. I reviewed the risks of bleeding, infection, bladder or adjacent organ injury, mesh complications with possible need for secondary surgeries, chronic pain or dysparunia, outlet obstruction with the need for CIC, increased urge symptoms, thrombotic events and anesthetic complications.  I reviewed the recovery period and the need to avoid lifting for 6 weeks. She has  a small cystocele and I will assess that at surgery.   We will get her set up in the near future.

## 2013-11-20 NOTE — Anesthesia Preprocedure Evaluation (Addendum)
Anesthesia Evaluation  Patient identified by MRN, date of birth, ID band Patient awake    Reviewed: Allergy & Precautions, H&P , NPO status , Patient's Chart, lab work & pertinent test results  Airway Mallampati: II  TM Distance: >3 FB Neck ROM: Full    Dental no notable dental hx.    Pulmonary neg pulmonary ROS,  breath sounds clear to auscultation  Pulmonary exam normal       Cardiovascular negative cardio ROS  Rhythm:Regular Rate:Normal     Neuro/Psych  Headaches, Anxiety    GI/Hepatic negative GI ROS, Neg liver ROS,   Endo/Other  negative endocrine ROS  Renal/GU negative Renal ROS  negative genitourinary   Musculoskeletal negative musculoskeletal ROS (+)   Abdominal (+) + obese,   Peds negative pediatric ROS (+)  Hematology negative hematology ROS (+)   Anesthesia Other Findings   Reproductive/Obstetrics negative OB ROS                             Anesthesia Physical Anesthesia Plan  ASA: II  Anesthesia Plan: General   Post-op Pain Management:    Induction: Intravenous  Airway Management Planned: Oral ETT  Additional Equipment:   Intra-op Plan:   Post-operative Plan: Extubation in OR  Informed Consent: I have reviewed the patients History and Physical, chart, labs and discussed the procedure including the risks, benefits and alternatives for the proposed anesthesia with the patient or authorized representative who has indicated his/her understanding and acceptance.   Dental advisory given  Plan Discussed with: CRNA  Anesthesia Plan Comments: (Plan ETT as she has some GERD today.)       Anesthesia Quick Evaluation

## 2013-11-21 ENCOUNTER — Ambulatory Visit (HOSPITAL_BASED_OUTPATIENT_CLINIC_OR_DEPARTMENT_OTHER)
Admission: RE | Admit: 2013-11-21 | Discharge: 2013-11-21 | Disposition: A | Discharge status: Home / Self Care | Payer: BC Managed Care – PPO | Source: Ambulatory Visit | Attending: Urology | Admitting: Urology

## 2013-11-21 ENCOUNTER — Encounter (HOSPITAL_BASED_OUTPATIENT_CLINIC_OR_DEPARTMENT_OTHER): Payer: BC Managed Care – PPO | Admitting: Anesthesiology

## 2013-11-21 ENCOUNTER — Ambulatory Visit (HOSPITAL_BASED_OUTPATIENT_CLINIC_OR_DEPARTMENT_OTHER): Payer: BC Managed Care – PPO | Admitting: Anesthesiology

## 2013-11-21 ENCOUNTER — Encounter (HOSPITAL_BASED_OUTPATIENT_CLINIC_OR_DEPARTMENT_OTHER)
Admission: RE | Disposition: A | Discharge status: Home / Self Care | Payer: Self-pay | Source: Ambulatory Visit | Attending: Urology

## 2013-11-21 ENCOUNTER — Encounter (HOSPITAL_BASED_OUTPATIENT_CLINIC_OR_DEPARTMENT_OTHER): Payer: Self-pay | Admitting: Anesthesiology

## 2013-11-21 DIAGNOSIS — Z87440 Personal history of urinary (tract) infections: Secondary | ICD-10-CM | POA: Insufficient documentation

## 2013-11-21 DIAGNOSIS — E669 Obesity, unspecified: Secondary | ICD-10-CM | POA: Insufficient documentation

## 2013-11-21 DIAGNOSIS — Z8739 Personal history of other diseases of the musculoskeletal system and connective tissue: Secondary | ICD-10-CM | POA: Diagnosis not present

## 2013-11-21 DIAGNOSIS — N3946 Mixed incontinence: Secondary | ICD-10-CM | POA: Diagnosis not present

## 2013-11-21 DIAGNOSIS — N318 Other neuromuscular dysfunction of bladder: Secondary | ICD-10-CM | POA: Insufficient documentation

## 2013-11-21 DIAGNOSIS — Z6830 Body mass index (BMI) 30.0-30.9, adult: Secondary | ICD-10-CM | POA: Diagnosis not present

## 2013-11-21 DIAGNOSIS — F419 Anxiety disorder, unspecified: Secondary | ICD-10-CM | POA: Insufficient documentation

## 2013-11-21 DIAGNOSIS — Z87442 Personal history of urinary calculi: Secondary | ICD-10-CM | POA: Diagnosis not present

## 2013-11-21 DIAGNOSIS — N8111 Cystocele, midline: Secondary | ICD-10-CM | POA: Insufficient documentation

## 2013-11-21 DIAGNOSIS — Z8249 Family history of ischemic heart disease and other diseases of the circulatory system: Secondary | ICD-10-CM | POA: Diagnosis not present

## 2013-11-21 DIAGNOSIS — N393 Stress incontinence (female) (male): Secondary | ICD-10-CM

## 2013-11-21 DIAGNOSIS — F1590 Other stimulant use, unspecified, uncomplicated: Secondary | ICD-10-CM | POA: Diagnosis not present

## 2013-11-21 DIAGNOSIS — N3642 Intrinsic sphincter deficiency (ISD): Secondary | ICD-10-CM | POA: Diagnosis not present

## 2013-11-21 HISTORY — DX: Migraine, unspecified, not intractable, without status migrainosus: G43.909

## 2013-11-21 HISTORY — DX: Mixed incontinence: N39.46

## 2013-11-21 HISTORY — DX: Intrinsic sphincter deficiency (ISD): N36.42

## 2013-11-21 HISTORY — DX: Personal history of urinary (tract) infections: Z87.440

## 2013-11-21 HISTORY — PX: PUBOVAGINAL SLING: SHX1035

## 2013-11-21 LAB — POCT HEMOGLOBIN-HEMACUE: Hemoglobin: 13.7 g/dL (ref 12.0–15.0)

## 2013-11-21 SURGERY — CREATION, PUBOVAGINAL SLING
Anesthesia: General | Site: Vagina

## 2013-11-21 MED ORDER — FENTANYL CITRATE 0.05 MG/ML IJ SOLN
INTRAMUSCULAR | Status: AC
  Filled 2013-11-21: qty 2

## 2013-11-21 MED ORDER — SODIUM CHLORIDE 0.9 % IV SOLN
250.0000 mL | INTRAVENOUS | Status: DC | PRN
  Filled 2013-11-21: qty 250

## 2013-11-21 MED ORDER — SODIUM CHLORIDE 0.9 % IJ SOLN
3.0000 mL | Freq: Two times a day (BID) | INTRAMUSCULAR | Status: DC
  Filled 2013-11-21: qty 3

## 2013-11-21 MED ORDER — ACETAMINOPHEN 650 MG RE SUPP
650.0000 mg | RECTAL | Status: DC | PRN
  Filled 2013-11-21: qty 1

## 2013-11-21 MED ORDER — CIPROFLOXACIN IN D5W 400 MG/200ML IV SOLN
INTRAVENOUS | Status: AC
  Filled 2013-11-21: qty 200

## 2013-11-21 MED ORDER — MIDAZOLAM HCL 5 MG/5ML IJ SOLN
INTRAMUSCULAR | Status: DC | PRN
  Administered 2013-11-21: 2 mg via INTRAVENOUS

## 2013-11-21 MED ORDER — LACTATED RINGERS IV SOLN
INTRAVENOUS | Status: DC
  Administered 2013-11-21: 10:00:00 via INTRAVENOUS
  Filled 2013-11-21: qty 1000

## 2013-11-21 MED ORDER — FENTANYL CITRATE 0.05 MG/ML IJ SOLN
INTRAMUSCULAR | Status: DC | PRN
  Administered 2013-11-21 (×2): 25 ug via INTRAVENOUS
  Administered 2013-11-21: 50 ug via INTRAVENOUS

## 2013-11-21 MED ORDER — CIPROFLOXACIN HCL 500 MG PO TABS: 500.0000 mg | ORAL_TABLET | Freq: Two times a day (BID) | ORAL | Status: DC

## 2013-11-21 MED ORDER — OXYCODONE HCL 5 MG PO TABS
5.0000 mg | ORAL_TABLET | ORAL | Status: DC | PRN
  Administered 2013-11-21: 5 mg via ORAL
  Filled 2013-11-21: qty 2

## 2013-11-21 MED ORDER — LIDOCAINE-EPINEPHRINE (PF) 1 %-1:200000 IJ SOLN
INTRAMUSCULAR | Status: DC | PRN
  Administered 2013-11-21: 3 mL

## 2013-11-21 MED ORDER — MIDAZOLAM HCL 2 MG/2ML IJ SOLN
INTRAMUSCULAR | Status: AC
  Filled 2013-11-21: qty 2

## 2013-11-21 MED ORDER — ONDANSETRON HCL 4 MG/2ML IJ SOLN
INTRAMUSCULAR | Status: DC | PRN
  Administered 2013-11-21: 4 mg via INTRAVENOUS

## 2013-11-21 MED ORDER — SODIUM CHLORIDE 0.9 % IJ SOLN
INTRAMUSCULAR | Status: AC
  Filled 2013-11-21: qty 10

## 2013-11-21 MED ORDER — LIDOCAINE HCL (CARDIAC) 20 MG/ML IV SOLN
INTRAVENOUS | Status: DC | PRN
  Administered 2013-11-21: 75 mg via INTRAVENOUS

## 2013-11-21 MED ORDER — OXYCODONE HCL 5 MG PO TABS
ORAL_TABLET | ORAL | Status: AC
  Filled 2013-11-21: qty 1

## 2013-11-21 MED ORDER — SODIUM CHLORIDE 0.9 % IJ SOLN
3.0000 mL | INTRAMUSCULAR | Status: DC | PRN
  Filled 2013-11-21: qty 3

## 2013-11-21 MED ORDER — CIPROFLOXACIN IN D5W 400 MG/200ML IV SOLN
400.0000 mg | INTRAVENOUS | Status: AC
  Administered 2013-11-21: 400 mg via INTRAVENOUS
  Filled 2013-11-21: qty 200

## 2013-11-21 MED ORDER — LACTATED RINGERS IV SOLN
INTRAVENOUS | Status: DC | PRN
  Administered 2013-11-21 (×2): via INTRAVENOUS

## 2013-11-21 MED ORDER — FENTANYL CITRATE 0.05 MG/ML IJ SOLN
INTRAMUSCULAR | Status: AC
  Filled 2013-11-21: qty 4

## 2013-11-21 MED ORDER — DOCUSATE SODIUM 100 MG PO CAPS: 100.0000 mg | ORAL_CAPSULE | Freq: Two times a day (BID) | ORAL | Status: DC

## 2013-11-21 MED ORDER — PROMETHAZINE HCL 25 MG/ML IJ SOLN
6.2500 mg | Freq: Four times a day (QID) | INTRAMUSCULAR | Status: DC | PRN
Start: 2013-11-21 — End: 2013-11-21
  Filled 2013-11-21: qty 1

## 2013-11-21 MED ORDER — PROPOFOL INFUSION 10 MG/ML OPTIME
INTRAVENOUS | Status: DC | PRN
  Administered 2013-11-21: 150 mL via INTRAVENOUS
  Administered 2013-11-21: 30 mL via INTRAVENOUS

## 2013-11-21 MED ORDER — ACETAMINOPHEN 325 MG PO TABS
650.0000 mg | ORAL_TABLET | ORAL | Status: DC | PRN
  Filled 2013-11-21: qty 2

## 2013-11-21 MED ORDER — SODIUM CHLORIDE 0.9 % IR SOLN
Status: DC | PRN
  Administered 2013-11-21: 12:00:00

## 2013-11-21 MED ORDER — HYDROCODONE-ACETAMINOPHEN 5-325 MG PO TABS: 1.0000 | ORAL_TABLET | Freq: Four times a day (QID) | ORAL | Status: DC | PRN

## 2013-11-21 MED ORDER — FENTANYL CITRATE 0.05 MG/ML IJ SOLN
25.0000 ug | INTRAMUSCULAR | Status: DC | PRN
  Administered 2013-11-21 (×2): 25 ug via INTRAVENOUS
  Filled 2013-11-21: qty 1

## 2013-11-21 MED ORDER — ACETAMINOPHEN 10 MG/ML IV SOLN
INTRAVENOUS | Status: DC | PRN
  Administered 2013-11-21: 1000 mg via INTRAVENOUS

## 2013-11-21 MED ORDER — DEXAMETHASONE SODIUM PHOSPHATE 10 MG/ML IJ SOLN
INTRAMUSCULAR | Status: DC | PRN
  Administered 2013-11-21: 10 mg via INTRAVENOUS

## 2013-11-21 MED ORDER — STERILE WATER FOR IRRIGATION IR SOLN
Status: DC | PRN
  Administered 2013-11-21: 3000 mL

## 2013-11-21 MED ORDER — PROMETHAZINE HCL 25 MG/ML IJ SOLN
INTRAMUSCULAR | Status: AC
  Filled 2013-11-21: qty 1

## 2013-11-21 SURGICAL SUPPLY — 35 items
BAG URINE DRAINAGE (UROLOGICAL SUPPLIES) ×3 IMPLANT
BLADE CLIPPER SURG (BLADE) ×3 IMPLANT
BLADE SURG 15 STRL LF DISP TIS (BLADE) ×1 IMPLANT
BLADE SURG 15 STRL SS (BLADE) ×2
CANISTER SUCTION 1200CC (MISCELLANEOUS) ×3 IMPLANT
CATH FOLEY 2WAY SLVR  5CC 16FR (CATHETERS) ×2
CATH FOLEY 2WAY SLVR 5CC 16FR (CATHETERS) ×1 IMPLANT
CLEANER CAUTERY TIP 5X5 PAD (MISCELLANEOUS) IMPLANT
CLOTH BEACON ORANGE TIMEOUT ST (SAFETY) ×3 IMPLANT
COVER MAYO STAND STRL (DRAPES) ×3 IMPLANT
DERMABOND ADVANCED (GAUZE/BANDAGES/DRESSINGS) ×2
DERMABOND ADVANCED .7 DNX12 (GAUZE/BANDAGES/DRESSINGS) ×1 IMPLANT
DRAPE LG THREE QUARTER DISP (DRAPES) ×3 IMPLANT
DRAPE UNDERBUTTOCKS STRL (DRAPE) ×3 IMPLANT
ELECT REM PT RETURN 9FT ADLT (ELECTROSURGICAL) ×3
ELECTRODE REM PT RTRN 9FT ADLT (ELECTROSURGICAL) ×1 IMPLANT
GAUZE PACKING IODOFORM 2 (PACKING) ×3 IMPLANT
GLOVE BIO SURGEON STRL SZ7 (GLOVE) ×6 IMPLANT
GLOVE INDICATOR 7.5 STRL GRN (GLOVE) ×3 IMPLANT
GLOVE SURG SS PI 8.0 STRL IVOR (GLOVE) ×3 IMPLANT
GOWN STRL REIN XL XLG (GOWN DISPOSABLE) ×3 IMPLANT
GOWN STRL REUS W/TWL XL LVL3 (GOWN DISPOSABLE) ×3 IMPLANT
HOLDER FOLEY CATH W/STRAP (MISCELLANEOUS) ×3 IMPLANT
NEEDLE HYPO 22GX1.5 SAFETY (NEEDLE) ×3 IMPLANT
PACK BASIN DAY SURGERY FS (CUSTOM PROCEDURE TRAY) ×3 IMPLANT
PACK CYSTO (CUSTOM PROCEDURE TRAY) ×3 IMPLANT
PAD CLEANER CAUTERY TIP 5X5 (MISCELLANEOUS)
PENCIL BUTTON HOLSTER BLD 10FT (ELECTRODE) IMPLANT
PLUG CATH AND CAP STER (CATHETERS) ×3 IMPLANT
SLING SYSTEM SPARC (Sling) ×3 IMPLANT
SUT VIC AB 2-0 UR6 27 (SUTURE) ×3 IMPLANT
SYRINGE 10CC LL (SYRINGE) ×3 IMPLANT
TUBE CONNECTING 12'X1/4 (SUCTIONS) ×1
TUBE CONNECTING 12X1/4 (SUCTIONS) ×2 IMPLANT
YANKAUER SUCT BULB TIP NO VENT (SUCTIONS) ×3 IMPLANT

## 2013-11-21 NOTE — Anesthesia Postprocedure Evaluation (Signed)
  Anesthesia Post-op Note  Patient: Dawn Rojas  Procedure(s) Performed: Procedure(s) (LRB): PUBO-VAGINAL SLING (N/A)  Patient Location: PACU  Anesthesia Type: General  Level of Consciousness: awake and alert   Airway and Oxygen Therapy: Patient Spontanous Breathing  Post-op Pain: mild  Post-op Assessment: Post-op Vital signs reviewed, Patient's Cardiovascular Status Stable, Respiratory Function Stable, Patent Airway and No signs of Nausea or vomiting  Last Vitals:  Filed Vitals:   11/21/13 1330  BP: 109/64  Pulse: 75  Temp:   Resp: 23    Post-op Vital Signs: stable   Complications: No apparent anesthesia complications

## 2013-11-21 NOTE — Transfer of Care (Signed)
Immediate Anesthesia Transfer of Care Note  Patient: Dawn Rojas  Procedure(s) Performed: Procedure(s) (LRB): PUBO-VAGINAL SLING (N/A)  Patient Location: PACU  Anesthesia Type: General  Level of Consciousness: awake, sedated, patient cooperative and responds to stimulation  Airway & Oxygen Therapy: Patient Spontanous Breathing and Patient connected to face mask oxygen  Post-op Assessment: Report given to PACU RN, Post -op Vital signs reviewed and stable and Patient moving all extremities  Post vital signs: Reviewed and stable  Complications: No apparent anesthesia complications

## 2013-11-21 NOTE — Anesthesia Procedure Notes (Signed)
Procedure Name: Intubation Date/Time: 11/21/2013 10:48 AM Performed by: Wanita Chamberlain Pre-anesthesia Checklist: Patient identified, Timeout performed, Emergency Drugs available, Suction available and Patient being monitored Patient Re-evaluated:Patient Re-evaluated prior to inductionOxygen Delivery Method: Circle system utilized Preoxygenation: Pre-oxygenation with 100% oxygen Intubation Type: IV induction Ventilation: Mask ventilation without difficulty Laryngoscope Size: Mac and 3 Grade View: Grade II Tube type: Oral Tube size: 7.0 mm Number of attempts: 1 Airway Equipment and Method: Stylet,  Bite block and LTA kit utilized Placement Confirmation: breath sounds checked- equal and bilateral,  ETT inserted through vocal cords under direct vision and positive ETCO2 Secured at: 21 cm Tube secured with: Tape Dental Injury: Teeth and Oropharynx as per pre-operative assessment

## 2013-11-21 NOTE — Brief Op Note (Signed)
11/21/2013  11:31 AM  PATIENT:  Dawn Rojas  43 y.o. female  PRE-OPERATIVE DIAGNOSIS:  URGE AND Stress Urinary Incontinence with Intrinsic Sphincter Deficiency   POST-OPERATIVE DIAGNOSIS:  Same  PROCEDURE:  Procedure(s): PUBO-VAGINAL SLING (N/A)  SURGEON:  Surgeon(s) and Role:    * Anner CreteJohn J Lorraine Cimmino, MD - Primary  PHYSICIAN ASSISTANT:   ASSISTANTS: none   ANESTHESIA:   local and general  EBL:     BLOOD ADMINISTERED:none  DRAINS: Urinary Catheter (Foley)   LOCAL MEDICATIONS USED:  LIDOCAINE   SPECIMEN:  No Specimen  DISPOSITION OF SPECIMEN:  N/A  COUNTS:  YES  TOURNIQUET:  * No tourniquets in log *  DICTATION: .Other Dictation: Dictation Number [id number]  PLAN OF CARE: Discharge to home after PACU  PATIENT DISPOSITION:  PACU - hemodynamically stable.   Delay start of Pharmacological VTE agent (>24hrs) due to surgical blood loss or risk of bleeding: not applicable

## 2013-11-21 NOTE — Discharge Instructions (Signed)
Urethral Vaginal Sling A urethral vaginal sling procedure is surgery to correct urinary incontinence. Urinary incontinence is uncontrolled loss of urine. It is common in women who have had children and in older women. In this surgery, a strong piece of material is placed under the tube that drains the bladder (urethra). This sling is made of tension-free vaginal tape or nylon mesh. It fits under the urethra like a hammock. The sling is put in position to straighten, support, and hold the urethra in its normal position.  LET [facility] LLC[facility]OUR HEALTH CARE PROVIDER KNOW ABOUT:   Any allergies you have.  All medicines you are taking, including vitamins, herbs, eye drops, creams, and over-the-counter medicines.  Previous problems you or members of your family have had with the use of anesthetics.  Any blood disorders you have.  Previous surgeries you have had.  Medical conditions you have. RISKS AND COMPLICATIONS  Generally, this is a safe procedure. However, as with any procedure, complications can occur. Possible complications include:  Infection.  Excessive bleeding.  Damage to other organs.  Problems urinating properly for several days or weeks.  Problems from the use of anesthetics.  Return of the urinary incontinence. BEFORE THE PROCEDURE   Ask your health care provider about changing or stopping your regular medicines. You may need to stop taking certain medicines 1 week before the surgery.  Do not eat or drink anything for 6-8 hours before the surgery.  If you smoke, do not smoke for at least 2 weeks before the surgery.  Make plans to have someone drive you home after your hospital stay. Also arrange for someone to help you with activities during recovery. PROCEDURE   You will have general or spinal anesthesia. With general anesthesia, you are asleep and will feel no pain. With spinal anesthesia, you are numb from the waist down, but you will still be awake.  A catheter is placed in  your bladder to drain urine during the procedure.  An incision is made in your vagina and low on your belly in the hairline.  The sling material is passed around your bladder neck and sutured to the muscles to hold the urethra in its normal position.  The incisions are closed. AFTER THE PROCEDURE   You will be taken to a recovery area where your progress will be monitored closely. Your breathing, blood pressure, and pulse (vital signs) will be checked often. When you are stable, you will be moved to a regular hospital room.  You may have a catheter in place to drain your bladder. This will stay in place until your bladder is working properly on its own.  . Document Released: 10/20/2007 Document Revised: 10/31/2012 Document Reviewed: 06/29/2012 [facility] LPExit[facility] Patient Information 2015 [place], [place]. This information is not intended to replace advice given to you by your health care provider. Make sure you discuss any questions you have with your health care provider. Urethral Vaginal Sling, Care After Refer to this sheet in the next few weeks. These instructions provide you with information on caring for yourself after your procedure. Your health care provider may also give you more specific instructions. Your treatment has been planned according to current medical practices, but problems sometimes occur. Call your health care provider if you have any problems or questions after your procedure.  WHAT TO EXPECT AFTER THE PROCEDURE  After your procedure, it is typical to have the following:  A catheter in your bladder until your bladder is able to work on its own properly.  You will be instructed on how to empty the catheter bag.  Absorbable stitches in your incisions. They will slowly dissolve over 1-2 months. HOME CARE INSTRUCTIONS  Get plenty of rest.  Only take over-the-counter or prescription medicines as directed by your health care provider. Do not take aspirin because it can cause  bleeding.  Do not take baths. Take showers until your health care provider tells you otherwise.  You may resume your usual diet. Eat a well-balanced diet.  Drink enough fluids to keep your urine clear or pale yellow.  Limit exercise and activities as directed by your health care provider. Do not lift anything heavier than 5 pounds (2.3 kg).  Do not douche, use tampons, or have sexual intercourse for 6 weeks after your procedure.  Follow up with your health care provider as directed. SEEK MEDICAL CARE IF:  You have a heavy or bad smelling vaginal discharge.   You have a rash.   You have pain that is not controlled with medicines.   You have lightheadedness or feel faint.  SEEK IMMEDIATE MEDICAL CARE IF:  You have a fever.   You have vaginal bleeding.   You faint.   You have shortness of breath.   You have chest, abdominal, or leg pain.   You have pain when urinating or cannot urinate.   Your catheter is still in your bladder and becomes blocked.   You have swelling, redness, and pain in the vaginal area.  Document Released: 10/31/2012 Document Reviewed: 10/31/2012   Post Anesthesia Home Care Instructions  Activity: Get plenty of rest for the remainder of the day. A responsible adult should stay with you for 24 hours following the procedure.  For the next 24 hours, DO NOT: -Drive a car -Advertising copywriterperate machinery -Drink alcoholic beverages -Take any medication unless instructed by your physician -Make any legal decisions or sign important papers.  Meals: Start with liquid foods such as gelatin or soup. Progress to regular foods as tolerated. Avoid greasy, spicy, heavy foods. If nausea and/or vomiting occur, drink only clear liquids until the nausea and/or vomiting subsides. Call your physician if vomiting continues.  Special Instructions/Symptoms: Your throat may feel dry or sore from the anesthesia or the breathing tube placed in your throat during surgery.  If this causes discomfort, gargle with warm salt water. The discomfort should disappear within 24 hours.  ExitCare Patient Information 2015 [place], [place]. This information is not intended to replace advice given to you by your health care provider. Make sure you discuss any questions you have with your health care provider.

## 2013-11-21 NOTE — Interval H&P Note (Signed)
History and Physical Interval Note:  11/21/2013 10:34 AM  Dawn Rojas  has presented today for surgery, with the diagnosis of URGE AND Stress Urinary Incontinence with Intrinsic Sphincter Deficiency   The various methods of treatment have been discussed with the patient and family. After consideration of risks, benefits and other options for treatment, the patient has consented to  Procedure(s): PUBO-VAGINAL SLING (N/A) as a surgical intervention .  The patient's history has been reviewed, patient examined, no change in status, stable for surgery.  I have reviewed the patient's chart and labs.  Questions were answered to the patient's satisfaction.     Lason Eveland J

## 2013-11-22 ENCOUNTER — Encounter (HOSPITAL_BASED_OUTPATIENT_CLINIC_OR_DEPARTMENT_OTHER): Payer: Self-pay | Admitting: Urology

## 2013-11-27 NOTE — Op Note (Signed)
NAMCharyl Rojas:  DOWDA, [name]                ACCOUNT NO.:  [id number]  MEDICAL RECORD NO.:  [id number]  LOCATION:                                 FACILITY:  PHYSICIAN:  Excell SeltzerJohn J. Annabell HowellsWrenn, M.D.    DATE OF BIRTH:  [birth year]  DATE OF PROCEDURE:  11/21/2013 DATE OF DISCHARGE:  11/21/2013                              OPERATIVE REPORT   PROCEDURE:  SPARC sling.  PREOPERATIVE DIAGNOSIS:  Mixed incontinence with intrinsic sphincter deficiency.  POSTOPERATIVE DIAGNOSIS:  Mixed incontinence with intrinsic sphincter deficiency.  SURGEON:  Excell SeltzerJohn J. Annabell HowellsWrenn, MD  ANESTHESIA:  General.  SPECIMEN:  None.  DRAINS:  Foley catheter and vaginal pack.  BLOOD LOSS:  Minimal.  COMPLICATIONS:  None.  INDICATIONS:  Dawn Rojas is a 43 year old white female with mixed incontinence with urodynamic evidence of intrinsic sphincter deficiency, has failed conservative therapy.  She has elected to undergo placement of mid urethral sling.  FINDINGS PROCEDURE:  She was given Cipro.  She was taken to the operating room where general anesthetic was induced.  She was placed in lithotomy position.  Her mons was clipped.  She was fitted with PAS hose.  Her perineum and genitalia were prepped with Betadine solution and she was draped in usual sterile fashion. A 16-French Foley catheter was inserted.  The bladder was drained.  A weighted vaginal retractor was placed.  The anterior vaginal wall over the mid urethra was infiltrated with approximately 10 mL of 1% lidocaine with epinephrine and a midline vaginal wall incision was made over the mid urethra.  The vaginal mucosa was elevated off the pubourethral fascia.  The fascia was then pierced laterally on each side, allow the tip of the finger adjacent to the urethra.  Two stab wounds were then made approximately 2 cm from the midline, 1 on the right, 1 on the left about 2 cm above the pubis.  The right SPARC trocar was passed through the right abdominal puncture and walked  along the back of the pubis until the tip could be felt by finger in the right side of the vaginal incision which was used to protect the urethra.  The tip of the trocar was then brought into the vaginal incision, this was then repeated on the left.  Cystoscopy was then performed using a 22-French scope and 70 degree lens.  Examination revealed no evidence of trocar injury to the bladder or urethra.  [facility] was then soaked in antibiotic solution and secured to the trocars, it was then drawn into position.  Cystoscopy was repeated with the 70 and 12-degree lens.  Once again no evidence of trocar mesh, the injury was noted in the bladder or urethra.  The bladder was otherwise unremarkable as well the ureteral orifices.  Once the mesh was positioned, the Foley catheter was replaced and the bladder was drained.  The trocars were cut away from the mesh and the mesh sheaths were irrigated with additional antibiotic solution.  The mesh was then tensioned and the sheaths were removed.  A small right angle clamp was used to ensure adequate laxity of the mesh over the mid urethra.  At this point, the anterior vaginal wall  was closed using a running locked 2-0 Vicryl suture.  The abdominal punctures had slight oozing, it was managed with pressure, but then they were closed with Dermabond.  A 2-inch iodoform vaginal pack was then placed.  The Foley catheter was placed to straight drainage.  The patient was taken down from lithotomy position.  Her anesthetic was reversed, she was moved to recovery room in stable condition.  There were no complications.     Excell SeltzerJohn J. Annabell HowellsWrenn, M.D.     JJW/MEDQ  D:  11/21/2013  T:  11/21/2013  Job:  [id number]

## 2014-03-14 ENCOUNTER — Other Ambulatory Visit: Payer: Self-pay

## 2014-03-14 DIAGNOSIS — Z1231 Encounter for screening mammogram for malignant neoplasm of breast: Secondary | ICD-10-CM

## 2014-03-27 ENCOUNTER — Ambulatory Visit
Admission: RE | Admit: 2014-03-27 | Discharge: 2014-03-27 | Disposition: A | Discharge status: Home / Self Care | Payer: BLUE CROSS/BLUE SHIELD | Source: Ambulatory Visit

## 2014-03-27 DIAGNOSIS — Z1231 Encounter for screening mammogram for malignant neoplasm of breast: Secondary | ICD-10-CM

## 2014-10-01 ENCOUNTER — Encounter: Payer: Self-pay | Admitting: Vascular Surgery

## 2014-10-01 ENCOUNTER — Other Ambulatory Visit: Payer: Self-pay

## 2014-10-01 DIAGNOSIS — I83813 Varicose veins of bilateral lower extremities with pain: Secondary | ICD-10-CM

## 2014-12-01 ENCOUNTER — Encounter: Payer: Self-pay | Admitting: Vascular Surgery

## 2014-12-03 ENCOUNTER — Ambulatory Visit (INDEPENDENT_AMBULATORY_CARE_PROVIDER_SITE_OTHER): Payer: BLUE CROSS/BLUE SHIELD | Admitting: Vascular Surgery

## 2014-12-03 ENCOUNTER — Encounter: Payer: Self-pay | Admitting: Vascular Surgery

## 2014-12-03 ENCOUNTER — Ambulatory Visit (HOSPITAL_COMMUNITY)
Admission: RE | Admit: 2014-12-03 | Discharge: 2014-12-03 | Disposition: A | Discharge status: Home / Self Care | Payer: BLUE CROSS/BLUE SHIELD | Source: Ambulatory Visit | Attending: Vascular Surgery | Admitting: Vascular Surgery

## 2014-12-03 VITALS — BP 113/64 | HR 67 | Temp 98.0°F | Resp 16 | Ht 64.0 in | Wt 164.0 lb

## 2014-12-03 DIAGNOSIS — I83813 Varicose veins of bilateral lower extremities with pain: Secondary | ICD-10-CM | POA: Insufficient documentation

## 2014-12-03 DIAGNOSIS — M25562 Pain in left knee: Secondary | ICD-10-CM | POA: Diagnosis not present
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
## 2014-12-04 ENCOUNTER — Encounter: Payer: Self-pay | Admitting: Internal Medicine

## 2015-01-20 ENCOUNTER — Telehealth: Payer: Self-pay | Admitting: Diagnostic Neuroimaging

## 2015-01-20 NOTE — Telephone Encounter (Signed)
Patient is calling and states she is supposed to come in when she is in pain per Dr. Richrd HumblesPenumalli's request to be checked so he can diagnose her.  Can you work her in please?  Please call her.

## 2015-01-20 NOTE — Telephone Encounter (Signed)
LVM requesting call back for more clarification on previous call. Patient has not been seen by Dr Marjory LiesPenumalli since July 2014.  Left this caller's name, number.

## 2015-02-25 ENCOUNTER — Other Ambulatory Visit: Payer: Self-pay

## 2015-02-25 DIAGNOSIS — Z1231 Encounter for screening mammogram for malignant neoplasm of breast: Secondary | ICD-10-CM

## 2015-04-03 ENCOUNTER — Ambulatory Visit: Payer: BLUE CROSS/BLUE SHIELD

## 2015-04-22 ENCOUNTER — Ambulatory Visit: Payer: BLUE CROSS/BLUE SHIELD

## 2015-05-15 ENCOUNTER — Ambulatory Visit
Admission: RE | Admit: 2015-05-15 | Discharge: 2015-05-15 | Disposition: A | Discharge status: Home / Self Care | Payer: BLUE CROSS/BLUE SHIELD | Source: Ambulatory Visit

## 2015-05-15 DIAGNOSIS — Z1231 Encounter for screening mammogram for malignant neoplasm of breast: Secondary | ICD-10-CM

## 2016-05-04 ENCOUNTER — Other Ambulatory Visit: Payer: Self-pay | Admitting: Internal Medicine

## 2016-05-04 DIAGNOSIS — Z1231 Encounter for screening mammogram for malignant neoplasm of breast: Secondary | ICD-10-CM

## 2016-07-13 ENCOUNTER — Ambulatory Visit: Payer: BLUE CROSS/BLUE SHIELD

## 2016-07-21 ENCOUNTER — Ambulatory Visit
Admission: RE | Admit: 2016-07-21 | Discharge: 2016-07-21 | Disposition: A | Discharge status: Home / Self Care | Payer: BLUE CROSS/BLUE SHIELD | Source: Ambulatory Visit | Attending: Internal Medicine | Admitting: Internal Medicine

## 2016-07-21 DIAGNOSIS — Z1231 Encounter for screening mammogram for malignant neoplasm of breast: Secondary | ICD-10-CM

## 2017-10-03 ENCOUNTER — Other Ambulatory Visit: Payer: Self-pay | Admitting: Physician Assistant

## 2017-10-03 DIAGNOSIS — Z1231 Encounter for screening mammogram for malignant neoplasm of breast: Secondary | ICD-10-CM

## 2017-11-15 ENCOUNTER — Ambulatory Visit
Admission: RE | Admit: 2017-11-15 | Discharge: 2017-11-15 | Disposition: A | Discharge status: Home / Self Care | Payer: BLUE CROSS/BLUE SHIELD | Source: Ambulatory Visit | Attending: Physician Assistant | Admitting: Physician Assistant

## 2017-11-15 DIAGNOSIS — Z1231 Encounter for screening mammogram for malignant neoplasm of breast: Secondary | ICD-10-CM

## 2017-11-16 ENCOUNTER — Other Ambulatory Visit: Payer: Self-pay | Admitting: Physician Assistant

## 2017-11-16 DIAGNOSIS — R928 Other abnormal and inconclusive findings on diagnostic imaging of breast: Secondary | ICD-10-CM

## 2017-11-21 ENCOUNTER — Other Ambulatory Visit: Payer: BLUE CROSS/BLUE SHIELD

## 2017-12-13 ENCOUNTER — Ambulatory Visit
Admission: RE | Admit: 2017-12-13 | Discharge: 2017-12-13 | Disposition: A | Discharge status: Home / Self Care | Payer: BLUE CROSS/BLUE SHIELD | Source: Ambulatory Visit | Attending: Physician Assistant | Admitting: Physician Assistant

## 2017-12-13 DIAGNOSIS — R928 Other abnormal and inconclusive findings on diagnostic imaging of breast: Secondary | ICD-10-CM

## 2018-11-29 ENCOUNTER — Other Ambulatory Visit: Payer: Self-pay | Admitting: Physician Assistant

## 2018-11-29 DIAGNOSIS — Z1231 Encounter for screening mammogram for malignant neoplasm of breast: Secondary | ICD-10-CM

## 2018-12-25 DIAGNOSIS — U071 COVID-19: Secondary | ICD-10-CM

## 2018-12-25 HISTORY — DX: COVID-19: U07.1

## 2019-01-23 ENCOUNTER — Ambulatory Visit
Admission: RE | Admit: 2019-01-23 | Discharge: 2019-01-23 | Disposition: A | Discharge status: Home / Self Care | Payer: BLUE CROSS/BLUE SHIELD | Source: Ambulatory Visit | Attending: Physician Assistant | Admitting: Physician Assistant

## 2019-01-23 ENCOUNTER — Other Ambulatory Visit: Payer: Self-pay

## 2019-01-23 DIAGNOSIS — Z1231 Encounter for screening mammogram for malignant neoplasm of breast: Secondary | ICD-10-CM

## 2019-02-20 ENCOUNTER — Other Ambulatory Visit: Payer: Self-pay | Admitting: Chiropractic Medicine

## 2019-02-20 DIAGNOSIS — M5417 Radiculopathy, lumbosacral region: Secondary | ICD-10-CM

## 2019-03-01 ENCOUNTER — Other Ambulatory Visit: Payer: Self-pay

## 2019-03-01 ENCOUNTER — Ambulatory Visit
Admission: RE | Admit: 2019-03-01 | Discharge: 2019-03-01 | Disposition: A | Discharge status: Home / Self Care | Payer: BC Managed Care – PPO | Source: Ambulatory Visit | Attending: Chiropractic Medicine | Admitting: Chiropractic Medicine

## 2019-03-01 DIAGNOSIS — M5417 Radiculopathy, lumbosacral region: Secondary | ICD-10-CM

## 2019-03-01 MED ORDER — METHYLPREDNISOLONE ACETATE 40 MG/ML INJ SUSP (RADIOLOG: 120.0000 mg | Freq: Once | INTRAMUSCULAR | Status: DC

## 2019-03-01 MED ORDER — IOPAMIDOL (ISOVUE-M 200) INJECTION 41%: 1.0000 mL | Freq: Once | INTRAMUSCULAR | Status: DC

## 2019-03-01 NOTE — Discharge Instructions (Signed)

## 2019-06-15 ENCOUNTER — Emergency Department (HOSPITAL_BASED_OUTPATIENT_CLINIC_OR_DEPARTMENT_OTHER)
Admission: EM | Admit: 2019-06-15 | Discharge: 2019-06-15 | Disposition: A | Discharge status: Home / Self Care | Payer: BC Managed Care – PPO | Attending: Emergency Medicine | Admitting: Emergency Medicine

## 2019-06-15 ENCOUNTER — Other Ambulatory Visit: Payer: Self-pay

## 2019-06-15 ENCOUNTER — Encounter (HOSPITAL_BASED_OUTPATIENT_CLINIC_OR_DEPARTMENT_OTHER): Payer: Self-pay | Admitting: Emergency Medicine

## 2019-06-15 DIAGNOSIS — Z79899 Other long term (current) drug therapy: Secondary | ICD-10-CM | POA: Diagnosis not present

## 2019-06-15 DIAGNOSIS — L03116 Cellulitis of left lower limb: Secondary | ICD-10-CM | POA: Diagnosis not present

## 2019-06-15 DIAGNOSIS — M25562 Pain in left knee: Secondary | ICD-10-CM | POA: Diagnosis present

## 2019-06-15 LAB — CBC WITH DIFFERENTIAL/PLATELET
Abs Immature Granulocytes: 0.04 10*3/uL (ref 0.00–0.07)
Basophils Absolute: 0 10*3/uL (ref 0.0–0.1)
Basophils Relative: 0 %
Eosinophils Absolute: 0 10*3/uL (ref 0.0–0.5)
Eosinophils Relative: 0 %
HCT: 39.6 % (ref 36.0–46.0)
Hemoglobin: 13.5 g/dL (ref 12.0–15.0)
Immature Granulocytes: 0 %
Lymphocytes Relative: 14 %
Lymphs Abs: 1.5 10*3/uL (ref 0.7–4.0)
MCH: 30.8 pg (ref 26.0–34.0)
MCHC: 34.1 g/dL (ref 30.0–36.0)
MCV: 90.4 fL (ref 80.0–100.0)
Monocytes Absolute: 0.9 10*3/uL (ref 0.1–1.0)
Monocytes Relative: 8 %
Neutro Abs: 8.6 10*3/uL — ABNORMAL HIGH (ref 1.7–7.7)
Neutrophils Relative %: 78 %
Platelets: 168 10*3/uL (ref 150–400)
RBC: 4.38 MIL/uL (ref 3.87–5.11)
RDW: 13.3 % (ref 11.5–15.5)
WBC: 11 10*3/uL — ABNORMAL HIGH (ref 4.0–10.5)
nRBC: 0 % (ref 0.0–0.2)

## 2019-06-15 LAB — BASIC METABOLIC PANEL
Anion gap: 11 (ref 5–15)
BUN: 14 mg/dL (ref 6–20)
CO2: 23 mmol/L (ref 22–32)
Calcium: 8.3 mg/dL — ABNORMAL LOW (ref 8.9–10.3)
Chloride: 101 mmol/L (ref 98–111)
Creatinine, Ser: 0.74 mg/dL (ref 0.44–1.00)
GFR calc Af Amer: >60 mL/min (ref >60)
GFR calc non Af Amer: >60 mL/min (ref >60)
Glucose, Bld: 97 mg/dL (ref 70–99)
Potassium: 3.2 mmol/L — ABNORMAL LOW (ref 3.5–5.1)
Sodium: 135 mmol/L (ref 135–145)

## 2019-06-15 MED ORDER — CEFAZOLIN SODIUM-DEXTROSE 1-4 GM/50ML-% IV SOLN
1.0000 g | Freq: Once | INTRAVENOUS | Status: AC
  Administered 2019-06-15: 1 g via INTRAVENOUS
  Filled 2019-06-15: qty 50

## 2019-06-15 MED ORDER — IBUPROFEN 600 MG PO TABS: 600.0000 mg | ORAL_TABLET | Freq: Four times a day (QID) | ORAL | 0 refills | Status: DC | PRN

## 2019-06-15 MED ORDER — LIDOCAINE-EPINEPHRINE (PF) 2 %-1:200000 IJ SOLN
10.0000 mL | Freq: Once | INTRAMUSCULAR | Status: AC
  Administered 2019-06-15: 10 mL
  Filled 2019-06-15: qty 10

## 2019-06-15 MED ORDER — DIAZEPAM 5 MG/ML IJ SOLN
5.0000 mg | Freq: Once | INTRAMUSCULAR | Status: AC
  Administered 2019-06-15: 5 mg via INTRAVENOUS
  Filled 2019-06-15: qty 2

## 2019-06-15 MED ORDER — SODIUM CHLORIDE 0.9 % IV BOLUS
1000.0000 mL | Freq: Once | INTRAVENOUS | Status: AC
  Administered 2019-06-15: 1000 mL via INTRAVENOUS

## 2019-06-15 MED ORDER — HYDROMORPHONE HCL 1 MG/ML IJ SOLN
1.0000 mg | Freq: Once | INTRAMUSCULAR | Status: AC
  Administered 2019-06-15: 1 mg via INTRAVENOUS
  Filled 2019-06-15: qty 1

## 2019-06-15 MED ORDER — OXYCODONE-ACETAMINOPHEN 5-325 MG PO TABS: 1.0000 | ORAL_TABLET | ORAL | 0 refills | Status: DC | PRN

## 2019-06-15 MED ORDER — CEPHALEXIN 500 MG PO CAPS: 500.0000 mg | ORAL_CAPSULE | Freq: Four times a day (QID) | ORAL | 0 refills | Status: DC

## 2019-06-15 MED ORDER — VANCOMYCIN HCL IN DEXTROSE 1-5 GM/200ML-% IV SOLN
1000.0000 mg | Freq: Once | INTRAVENOUS | Status: AC
  Administered 2019-06-15: 1000 mg via INTRAVENOUS
  Filled 2019-06-15: qty 200

## 2019-06-15 MED ORDER — ONDANSETRON HCL 4 MG/2ML IJ SOLN
4.0000 mg | Freq: Once | INTRAMUSCULAR | Status: AC
  Administered 2019-06-15: 4 mg via INTRAVENOUS
  Filled 2019-06-15: qty 2

## 2019-06-15 MED ORDER — MORPHINE SULFATE (PF) 4 MG/ML IV SOLN
4.0000 mg | Freq: Once | INTRAVENOUS | Status: AC
  Administered 2019-06-15: 4 mg via INTRAVENOUS
  Filled 2019-06-15: qty 1

## 2019-06-15 MED ORDER — SULFAMETHOXAZOLE-TRIMETHOPRIM 800-160 MG PO TABS: 1.0000 | ORAL_TABLET | Freq: Two times a day (BID) | ORAL | 0 refills | Status: AC

## 2019-06-15 NOTE — ED Provider Notes (Signed)
[name]   CSN: [id number] Arrival date & time: 06/15/19  1753     History Chief Complaint  Patient presents with  . Knee Pain    Dawn Rojas is a 49 y.o. female.  Pt presents to the ED today with left knee pain.  No known injury.  Pt said it started this am.  She is a poor historian as she is moaning in bed.  Pt had initially gone to another ED and waited for over 2 hrs.  She said the redness has worsened.  Pt said it started as a pimple which she tried to pop.  She also stuck a needle in it but could not get anything out.  It worsened from there.        Past Medical History:  Diagnosis Date  . Anxiety   . Depressive disorder   . History of recurrent UTIs   . Hyperlipidemia   . IBS (irritable bowel syndrome)   . Intrinsic urethral sphincter deficiency   . Migraine   . Urge and stress incontinence   . Varicose veins   . Vitamin D deficiency     Patient Active Problem List   Diagnosis Date Noted  . Left knee pain 12/03/2014  . Migraine without aura 08/22/2012    Past Surgical History:  Procedure Laterality Date  . ABDOMINAL HYSTERECTOMY  2010  . AUGMENTATION MAMMAPLASTY    . BREAST ENHANCEMENT SURGERY    . CESAREAN SECTION  1999  . PUBOVAGINAL SLING N/A 11/21/2013   Procedure: Gaynelle Rojas;  Surgeon: Malka So, MD;  Location: [facility];  Service: Urology;  Laterality: N/A;     OB History   No obstetric history on file.     Family History  Problem Relation Age of Onset  . Cancer Maternal Grandmother   . Cancer Paternal Grandfather   . Breast cancer Maternal Aunt     Social History   Tobacco Use  . Smoking status: Never Smoker  . Smokeless tobacco: Never Used  Substance Use Topics  . Alcohol use: No  . Drug use: No    Home Medications Prior to Admission medications   Medication Sig Start Date End Date Taking? Authorizing Provider  aspirin-acetaminophen-caffeine  (EXCEDRIN MIGRAINE) [phone] MG per tablet Take 1 tablet by mouth every 6 (six) hours as needed for pain.    [provider]  atorvastatin (LIPITOR) 10 MG tablet at bedtime as needed and may repeat dose one time if needed. 02/12/14   [provider]  CAMBIA 50 MG PACK Take 1 each by mouth every 6 (six) hours as needed. Patient not taking: Reported on 12/03/2014 11/21/12   Philmore Pali, NP  cephALEXin (KEFLEX) 500 MG capsule Take 1 capsule (500 mg total) by mouth 4 (four) times daily. 06/15/19   Isla Pence, MD  ciprofloxacin (CIPRO) 500 MG tablet Take 1 tablet (500 mg total) by mouth 2 (two) times daily. Patient not taking: Reported on 12/03/2014 11/21/13   Irine Seal, MD  docusate sodium (COLACE) 100 MG capsule Take 1 capsule (100 mg total) by mouth 2 (two) times daily. Patient not taking: Reported on 12/03/2014 11/21/13   Irine Seal, MD  fluticasone [facility]) 50 MCG/ACT nasal spray at bedtime. 01/23/14   [provider]  HYDROcodone-acetaminophen (NORCO) 5-325 MG per tablet Take 1 tablet by mouth every 6 (six) hours as needed for moderate pain. Patient not taking: Reported on 12/03/2014 11/21/13   Irine Seal,  MD  ibuprofen (ADVIL) 600 MG tablet Take 1 tablet (600 mg total) by mouth every 6 (six) hours as needed. 06/15/19   Jacalyn Lefevre, MD  montelukast (SINGULAIR) 10 MG tablet at bedtime as needed and may repeat dose one time if needed. 02/12/14   [provider]  oxyCODONE-acetaminophen (PERCOCET/ROXICET) 5-325 MG tablet Take 1 tablet by mouth every 4 (four) hours as needed for severe pain. 06/15/19   Jacalyn Lefevre, MD  phentermine 37.5 MG capsule Take 37.5 mg by mouth daily.    [provider]  propranolol ER (INDERAL LA) 60 MG 24 hr capsule Take 1 capsule (60 mg total) by mouth daily. Patient not taking: Reported on 12/03/2014 11/21/12   Ronal Fear, NP  rizatriptan (MAXALT-MLT) 10 MG disintegrating tablet Take 1 tablet (10 mg total) by mouth as  needed for migraine. May repeat in 2 hours if needed 08/22/12   Penumalli, Glenford Bayley, MD  rOPINIRole (REQUIP) 1 MG tablet Take 1 mg by mouth at bedtime.    [provider]  sulfamethoxazole-trimethoprim (BACTRIM DS) 800-160 MG tablet Take 1 tablet by mouth 2 (two) times daily for 7 days. 06/15/19 06/22/19  Jacalyn Lefevre, MD  traZODone (DESYREL) 100 MG tablet at bedtime. 10/13/14   [provider]  traZODone (DESYREL) 50 MG tablet Take 50 mg by mouth at bedtime as needed for sleep.    [provider]    Allergies    Levsin [hyoscyamine sulfate] and Nitrofuran derivatives  Review of Systems   Review of Systems  Musculoskeletal:       Left knee pain  Skin: Positive for rash.    Physical Exam Updated Vital Signs BP 119/80 (BP Location: Left Arm)   Pulse (!) 114   Temp 98.8 F (37.1 C) (Oral)   Resp 18   Ht 5\' 4"  (1.626 m)   Wt 84.8 kg   SpO2 99%   BMI 32.10 kg/m   Physical Exam Vitals and nursing note reviewed.  Constitutional:      Appearance: Normal appearance.  HENT:     Head: Normocephalic and atraumatic.     Right Ear: External ear normal.     Left Ear: External ear normal.     Nose: Nose normal.     Mouth/Throat:     Mouth: Mucous membranes are moist.     Pharynx: Oropharynx is clear.  Eyes:     Extraocular Movements: Extraocular movements intact.     Conjunctiva/sclera: Conjunctivae normal.     Pupils: Pupils are equal, round, and reactive to light.  Cardiovascular:     Rate and Rhythm: Normal rate and regular rhythm.     Pulses: Normal pulses.     Heart sounds: Normal heart sounds.  Pulmonary:     Effort: Pulmonary effort is normal.     Breath sounds: Normal breath sounds.  Abdominal:     General: Abdomen is flat. Bowel sounds are normal.     Palpations: Abdomen is soft.  Musculoskeletal:        General: Normal range of motion.     Cervical back: Normal range of motion and neck supple.  Skin:    Capillary Refill: Capillary refill  takes less than 2 seconds.     Comments: Cellulitis left knee.  Cellulitis only over the top of the knee. Abscess over knee.   Neurological:     General: No focal deficit present.     Mental Status: She is alert and oriented to person, place, and time.  Psychiatric:        Mood and Affect: Mood normal.        Behavior: Behavior normal.     ED Results / Procedures / Treatments   Labs (all labs ordered are listed, but only abnormal results are displayed) Labs Reviewed  BASIC METABOLIC PANEL - Abnormal; Notable for the following components:      Result Value   Potassium 3.2 (*)    Calcium 8.3 (*)    All other components within normal limits  CBC WITH DIFFERENTIAL/PLATELET - Abnormal; Notable for the following components:   WBC 11.0 (*)    Neutro Abs 8.6 (*)    All other components within normal limits    EKG None  Radiology No results found.  Procedures .Marland KitchenIncision and Drainage  Date/Time: 06/15/2019 8:05 PM Performed by: Jacalyn Lefevre, MD Authorized by: Jacalyn Lefevre, MD   Consent:    Consent obtained:  Verbal   Consent given by:  Patient   Risks discussed:  Incomplete drainage and pain   Alternatives discussed:  No treatment Location:    Type:  Abscess   Location:  Lower extremity   Lower extremity location:  Knee   Knee location:  L knee Pre-procedure details:    Skin preparation:  Betadine Anesthesia (see MAR for exact dosages):    Anesthesia method:  Local infiltration   Local anesthetic:  Lidocaine 2% WITH epi Procedure type:    Complexity:  Simple Procedure details:    Incision types:  Cruciate   Scalpel blade:  11   Wound management:  Probed and deloculated   Drainage:  Bloody and purulent   Drainage amount:  Scant   Wound treatment:  Wound left open   Packing materials:  None Post-procedure details:    Patient tolerance of procedure:  Tolerated well, no immediate complications   (including critical care time)  Medications Ordered in  ED Medications  sodium chloride 0.9 % bolus 1,000 mL (0 mLs Intravenous Stopped 06/15/19 2018)  morphine 4 MG/ML injection 4 mg (4 mg Intravenous Given 06/15/19 1910)  ondansetron (ZOFRAN) injection 4 mg (4 mg Intravenous Given 06/15/19 1909)  diazepam (VALIUM) injection 5 mg (5 mg Intravenous Given 06/15/19 1911)  vancomycin (VANCOCIN) IVPB 1000 mg/200 mL premix (0 mg Intravenous Stopped 06/15/19 2049)  ceFAZolin (ANCEF) IVPB 1 g/50 mL premix (0 g Intravenous Stopped 06/15/19 1948)  lidocaine-EPINEPHrine (XYLOCAINE W/EPI) 2 %-1:200000 (PF) injection 10 mL (10 mLs Infiltration Given 06/15/19 1917)  HYDROmorphone (DILAUDID) injection 1 mg (1 mg Intravenous Given 06/15/19 1945)  diazepam (VALIUM) injection 5 mg (5 mg Intravenous Given 06/15/19 2017)    ED Course  I have reviewed the triage vital signs and the nursing notes.  Pertinent labs & imaging results that were available during my care of the patient were reviewed by me and considered in my medical decision making (see chart for details).    MDM Rules/Calculators/A&P                      I don't think she has a septic joint as redness is just on top of the knee in the location where she had a pimple that she'd been trying to pop.  Pain is much better.  Pt is stable for d/c.  Return if worse. Final Clinical Impression(s) / ED Diagnoses Final diagnoses:  Cellulitis of knee, left    Rx / DC Orders ED Discharge Orders         Ordered  cephALEXin (KEFLEX) 500 MG capsule  4 times daily     06/15/19 2123    sulfamethoxazole-trimethoprim (BACTRIM DS) 800-160 MG tablet  2 times daily     06/15/19 2123    oxyCODONE-acetaminophen (PERCOCET/ROXICET) 5-325 MG tablet  Every 4 hours PRN     06/15/19 2123    ibuprofen (ADVIL) 600 MG tablet  Every 6 hours PRN     06/15/19 2123           Jacalyn Lefevre, MD 06/15/19 2124

## 2019-06-15 NOTE — ED Triage Notes (Signed)
L knee redness, pain , and swelling that started this morning. No known injury.

## 2019-06-15 NOTE — ED Notes (Signed)
Sleeping soundly after giving last dose of valium.  O2 sat dropped to 85% on RA.  Placed on O2 at 2L.  Sat rebounds to upper 90's on room air when talking with patient then she drift off to sleep and it drops again.  Left at 2L.  O2 sat currently 98%.  Dr made aware.  Encouraged to call for assistance as needed.

## 2019-11-11 ENCOUNTER — Other Ambulatory Visit: Payer: Self-pay | Admitting: Physical Medicine and Rehabilitation

## 2019-11-18 ENCOUNTER — Other Ambulatory Visit: Payer: Self-pay | Admitting: Physical Medicine and Rehabilitation

## 2019-11-18 DIAGNOSIS — M47816 Spondylosis without myelopathy or radiculopathy, lumbar region: Secondary | ICD-10-CM

## 2019-11-27 ENCOUNTER — Inpatient Hospital Stay: Admission: RE | Admit: 2019-11-27 | Payer: BC Managed Care – PPO | Source: Ambulatory Visit

## 2019-12-06 ENCOUNTER — Other Ambulatory Visit: Payer: BC Managed Care – PPO

## 2019-12-18 ENCOUNTER — Other Ambulatory Visit: Payer: Self-pay | Admitting: Physician Assistant

## 2019-12-18 DIAGNOSIS — Z1231 Encounter for screening mammogram for malignant neoplasm of breast: Secondary | ICD-10-CM

## 2020-02-07 ENCOUNTER — Ambulatory Visit: Payer: BC Managed Care – PPO

## 2020-03-27 ENCOUNTER — Ambulatory Visit
Admission: RE | Admit: 2020-03-27 | Discharge: 2020-03-27 | Disposition: A | Discharge status: Home / Self Care | Payer: BC Managed Care – PPO | Source: Ambulatory Visit | Attending: Physician Assistant | Admitting: Physician Assistant

## 2020-03-27 ENCOUNTER — Other Ambulatory Visit: Payer: Self-pay

## 2020-03-27 DIAGNOSIS — Z1231 Encounter for screening mammogram for malignant neoplasm of breast: Secondary | ICD-10-CM

## 2020-06-01 ENCOUNTER — Encounter (HOSPITAL_BASED_OUTPATIENT_CLINIC_OR_DEPARTMENT_OTHER): Payer: Self-pay | Admitting: Orthopedic Surgery

## 2020-06-01 ENCOUNTER — Other Ambulatory Visit: Payer: Self-pay

## 2020-06-01 DIAGNOSIS — Z79899 Other long term (current) drug therapy: Secondary | ICD-10-CM | POA: Diagnosis not present

## 2020-06-01 DIAGNOSIS — S83282A Other tear of lateral meniscus, current injury, left knee, initial encounter: Secondary | ICD-10-CM | POA: Diagnosis not present

## 2020-06-01 DIAGNOSIS — M6752 Plica syndrome, left knee: Secondary | ICD-10-CM | POA: Diagnosis present

## 2020-06-01 DIAGNOSIS — Z8616 Personal history of COVID-19: Secondary | ICD-10-CM | POA: Diagnosis not present

## 2020-06-01 DIAGNOSIS — X58XXXA Exposure to other specified factors, initial encounter: Secondary | ICD-10-CM | POA: Diagnosis not present

## 2020-06-01 DIAGNOSIS — M94262 Chondromalacia, left knee: Secondary | ICD-10-CM | POA: Diagnosis not present

## 2020-06-01 DIAGNOSIS — Z888 Allergy status to other drugs, medicaments and biological substances status: Secondary | ICD-10-CM | POA: Diagnosis not present

## 2020-06-01 NOTE — Progress Notes (Addendum)
  Spoke w/ via phone for pre-op interview---PT  Lab needs dos---NONE-               Lab results------NONE COVID test ------06-04-2020 1500 Arrive at -------1045 AM 05-26-2020 NPO after MN NO Solid Food.  Clear liquids from MN until---945 AM THEN NPO Med rec completed Medications to take morning of surgery -----TOPOMAX, TIZANIDINE Diabetic medication -----N/A Patient instructed to bring photo id and insurance card day of surgery Patient aware to have Driver (ride ) / caregiver COUSIN DONNA LABOUNTY WILL STAY    for 24 hours after surgery  Patient Special Instructions -----NONE Pre-Op special Istructions -----NONE Patient verbalized understanding of instructions that were given at this phone interview. Patient denies shortness of breath, chest pain, fever, cough at this phone interview  ONCOLOGY LOV DR  Remi Deter 08-16-2019 (THRYOID NODULES) CARE EVERYWHERE  US SOFT TISSUE HEAD AND NECK 08-07-2019 CARE EVERYWHERE

## 2020-06-04 ENCOUNTER — Other Ambulatory Visit (HOSPITAL_COMMUNITY)
Admission: RE | Admit: 2020-06-04 | Discharge: 2020-06-04 | Disposition: A | Discharge status: Home / Self Care | Payer: BC Managed Care – PPO | Source: Ambulatory Visit | Attending: Orthopedic Surgery | Admitting: Orthopedic Surgery

## 2020-06-04 DIAGNOSIS — M6752 Plica syndrome, left knee: Secondary | ICD-10-CM | POA: Diagnosis not present

## 2020-06-04 DIAGNOSIS — Z20822 Contact with and (suspected) exposure to covid-19: Secondary | ICD-10-CM | POA: Insufficient documentation

## 2020-06-04 DIAGNOSIS — Z01812 Encounter for preprocedural laboratory examination: Secondary | ICD-10-CM | POA: Insufficient documentation

## 2020-06-05 ENCOUNTER — Ambulatory Visit (HOSPITAL_BASED_OUTPATIENT_CLINIC_OR_DEPARTMENT_OTHER): Payer: BC Managed Care – PPO | Admitting: Anesthesiology

## 2020-06-05 ENCOUNTER — Other Ambulatory Visit: Payer: Self-pay

## 2020-06-05 ENCOUNTER — Encounter (HOSPITAL_BASED_OUTPATIENT_CLINIC_OR_DEPARTMENT_OTHER): Payer: Self-pay | Admitting: Orthopedic Surgery

## 2020-06-05 ENCOUNTER — Encounter (HOSPITAL_BASED_OUTPATIENT_CLINIC_OR_DEPARTMENT_OTHER)
Admission: RE | Disposition: A | Discharge status: Home / Self Care | Payer: Self-pay | Source: Home / Self Care | Attending: Orthopedic Surgery

## 2020-06-05 ENCOUNTER — Ambulatory Visit (HOSPITAL_BASED_OUTPATIENT_CLINIC_OR_DEPARTMENT_OTHER)
Admission: RE | Admit: 2020-06-05 | Discharge: 2020-06-05 | Disposition: A | Discharge status: Home / Self Care | Payer: BC Managed Care – PPO | Attending: Orthopedic Surgery | Admitting: Orthopedic Surgery

## 2020-06-05 DIAGNOSIS — X58XXXA Exposure to other specified factors, initial encounter: Secondary | ICD-10-CM | POA: Insufficient documentation

## 2020-06-05 DIAGNOSIS — Z8616 Personal history of COVID-19: Secondary | ICD-10-CM | POA: Insufficient documentation

## 2020-06-05 DIAGNOSIS — Z79899 Other long term (current) drug therapy: Secondary | ICD-10-CM | POA: Insufficient documentation

## 2020-06-05 DIAGNOSIS — S83282A Other tear of lateral meniscus, current injury, left knee, initial encounter: Secondary | ICD-10-CM | POA: Insufficient documentation

## 2020-06-05 DIAGNOSIS — M6752 Plica syndrome, left knee: Secondary | ICD-10-CM | POA: Diagnosis not present

## 2020-06-05 DIAGNOSIS — M94262 Chondromalacia, left knee: Secondary | ICD-10-CM | POA: Insufficient documentation

## 2020-06-05 DIAGNOSIS — Z888 Allergy status to other drugs, medicaments and biological substances status: Secondary | ICD-10-CM | POA: Insufficient documentation

## 2020-06-05 HISTORY — PX: KNEE ARTHROSCOPY: SHX127

## 2020-06-05 HISTORY — DX: Nontoxic multinodular goiter: E04.2

## 2020-06-05 HISTORY — DX: Unspecified osteoarthritis, unspecified site: M19.90

## 2020-06-05 HISTORY — DX: Overactive bladder: N32.81

## 2020-06-05 HISTORY — DX: Plica syndrome, left knee: M67.52

## 2020-06-05 LAB — SARS CORONAVIRUS 2 (TAT 6-24 HRS): SARS Coronavirus 2: NEGATIVE

## 2020-06-05 SURGERY — ARTHROSCOPY, KNEE
Anesthesia: General | Site: Knee | Laterality: Left

## 2020-06-05 MED ORDER — BUPIVACAINE HCL 0.25 % IJ SOLN
INTRAMUSCULAR | Status: DC | PRN
  Administered 2020-06-05: 15 mL

## 2020-06-05 MED ORDER — MEPERIDINE HCL 25 MG/ML IJ SOLN: 6.2500 mg | INTRAMUSCULAR | Status: DC | PRN

## 2020-06-05 MED ORDER — MIDAZOLAM HCL 2 MG/2ML IJ SOLN
INTRAMUSCULAR | Status: AC
  Filled 2020-06-05: qty 2

## 2020-06-05 MED ORDER — KETOROLAC TROMETHAMINE 30 MG/ML IJ SOLN
INTRAMUSCULAR | Status: AC
  Filled 2020-06-05: qty 1

## 2020-06-05 MED ORDER — ACETAMINOPHEN 500 MG PO TABS
ORAL_TABLET | ORAL | Status: AC
  Filled 2020-06-05: qty 2

## 2020-06-05 MED ORDER — PROPOFOL 10 MG/ML IV BOLUS
INTRAVENOUS | Status: DC | PRN
  Administered 2020-06-05: 150 mg via INTRAVENOUS

## 2020-06-05 MED ORDER — CEFAZOLIN SODIUM-DEXTROSE 2-4 GM/100ML-% IV SOLN
INTRAVENOUS | Status: AC
  Filled 2020-06-05: qty 100

## 2020-06-05 MED ORDER — SCOPOLAMINE 1 MG/3DAYS TD PT72
MEDICATED_PATCH | TRANSDERMAL | Status: AC
  Filled 2020-06-05: qty 1

## 2020-06-05 MED ORDER — LIDOCAINE 2% (20 MG/ML) 5 ML SYRINGE
INTRAMUSCULAR | Status: AC
  Filled 2020-06-05: qty 5

## 2020-06-05 MED ORDER — OXYCODONE HCL 5 MG PO TABS
5.0000 mg | ORAL_TABLET | Freq: Once | ORAL | Status: AC | PRN
  Administered 2020-06-05: 5 mg via ORAL

## 2020-06-05 MED ORDER — DEXAMETHASONE SODIUM PHOSPHATE 4 MG/ML IJ SOLN
INTRAMUSCULAR | Status: DC | PRN
  Administered 2020-06-05: 10 mg via INTRAVENOUS

## 2020-06-05 MED ORDER — LIDOCAINE 2% (20 MG/ML) 5 ML SYRINGE
INTRAMUSCULAR | Status: DC | PRN
  Administered 2020-06-05: 60 mg via INTRAVENOUS

## 2020-06-05 MED ORDER — ONDANSETRON HCL 4 MG/2ML IJ SOLN
INTRAMUSCULAR | Status: DC | PRN
  Administered 2020-06-05: 4 mg via INTRAVENOUS

## 2020-06-05 MED ORDER — KETOROLAC TROMETHAMINE 30 MG/ML IJ SOLN: 30.0000 mg | Freq: Once | INTRAMUSCULAR | Status: DC | PRN

## 2020-06-05 MED ORDER — FENTANYL CITRATE (PF) 100 MCG/2ML IJ SOLN
INTRAMUSCULAR | Status: AC
  Filled 2020-06-05: qty 2

## 2020-06-05 MED ORDER — ONDANSETRON HCL 4 MG/2ML IJ SOLN
INTRAMUSCULAR | Status: AC
  Filled 2020-06-05: qty 2

## 2020-06-05 MED ORDER — OXYCODONE HCL 5 MG/5ML PO SOLN: 5.0000 mg | Freq: Once | ORAL | Status: AC | PRN

## 2020-06-05 MED ORDER — ACETAMINOPHEN 500 MG PO TABS
1000.0000 mg | ORAL_TABLET | Freq: Once | ORAL | Status: AC
  Administered 2020-06-05: 1000 mg via ORAL

## 2020-06-05 MED ORDER — PROPOFOL 10 MG/ML IV BOLUS
INTRAVENOUS | Status: AC
  Filled 2020-06-05: qty 20

## 2020-06-05 MED ORDER — FENTANYL CITRATE (PF) 100 MCG/2ML IJ SOLN
INTRAMUSCULAR | Status: DC | PRN
  Administered 2020-06-05 (×4): 25 ug via INTRAVENOUS

## 2020-06-05 MED ORDER — SODIUM CHLORIDE 0.9 % IR SOLN
Status: DC | PRN
  Administered 2020-06-05: 2000 mL

## 2020-06-05 MED ORDER — MIDAZOLAM HCL 5 MG/5ML IJ SOLN
INTRAMUSCULAR | Status: DC | PRN
  Administered 2020-06-05: 2 mg via INTRAVENOUS

## 2020-06-05 MED ORDER — HYDROMORPHONE HCL 1 MG/ML IJ SOLN
INTRAMUSCULAR | Status: AC
  Filled 2020-06-05: qty 1

## 2020-06-05 MED ORDER — 0.9 % SODIUM CHLORIDE (POUR BTL) OPTIME
TOPICAL | Status: DC | PRN
  Administered 2020-06-05: 500 mL

## 2020-06-05 MED ORDER — DEXAMETHASONE SODIUM PHOSPHATE 10 MG/ML IJ SOLN
INTRAMUSCULAR | Status: AC
  Filled 2020-06-05: qty 1

## 2020-06-05 MED ORDER — SCOPOLAMINE 1 MG/3DAYS TD PT72
1.0000 | MEDICATED_PATCH | TRANSDERMAL | Status: DC
  Administered 2020-06-05: 1.5 mg via TRANSDERMAL

## 2020-06-05 MED ORDER — LACTATED RINGERS IV SOLN
INTRAVENOUS | Status: DC
  Administered 2020-06-05: 1000 mL via INTRAVENOUS

## 2020-06-05 MED ORDER — OXYCODONE HCL 5 MG PO TABS
ORAL_TABLET | ORAL | Status: AC
  Filled 2020-06-05: qty 1

## 2020-06-05 MED ORDER — PROMETHAZINE HCL 25 MG/ML IJ SOLN: 6.2500 mg | INTRAMUSCULAR | Status: DC | PRN

## 2020-06-05 MED ORDER — HYDROCODONE-ACETAMINOPHEN 5-325 MG PO TABS: 1.0000 | ORAL_TABLET | ORAL | 0 refills | Status: AC | PRN

## 2020-06-05 MED ORDER — KETOROLAC TROMETHAMINE 30 MG/ML IJ SOLN
INTRAMUSCULAR | Status: DC | PRN
  Administered 2020-06-05: 30 mg via INTRAVENOUS

## 2020-06-05 MED ORDER — ONDANSETRON 4 MG PO TBDP: 4.0000 mg | ORAL_TABLET | Freq: Three times a day (TID) | ORAL | 0 refills | Status: DC | PRN

## 2020-06-05 MED ORDER — HYDROMORPHONE HCL 1 MG/ML IJ SOLN
0.2500 mg | INTRAMUSCULAR | Status: DC | PRN
  Administered 2020-06-05 (×2): 0.5 mg via INTRAVENOUS

## 2020-06-05 MED ORDER — CEFAZOLIN SODIUM-DEXTROSE 2-4 GM/100ML-% IV SOLN
2.0000 g | INTRAVENOUS | Status: AC
  Administered 2020-06-05: 2 g via INTRAVENOUS

## 2020-06-05 SURGICAL SUPPLY — 35 items
ABLATOR ASPIRATE 50D MULTI-PRT (SURGICAL WAND) IMPLANT
BANDAGE ESMARK 6X9 LF (GAUZE/BANDAGES/DRESSINGS) IMPLANT
BLADE SHAVER TORPEDO 4X13 (MISCELLANEOUS) ×2 IMPLANT
BNDG CMPR 9X6 STRL LF SNTH (GAUZE/BANDAGES/DRESSINGS)
BNDG ELASTIC 6X5.8 VLCR STR LF (GAUZE/BANDAGES/DRESSINGS) ×2 IMPLANT
BNDG ESMARK 6X9 LF (GAUZE/BANDAGES/DRESSINGS)
COVER WAND RF STERILE (DRAPES) ×2 IMPLANT
CUFF TOURN SGL QUICK 34 (TOURNIQUET CUFF)
CUFF TRNQT CYL 34X4.125X (TOURNIQUET CUFF) IMPLANT
DRAPE ARTHROSCOPY W/POUCH 114 (DRAPES) ×2 IMPLANT
DRAPE U-SHAPE 47X51 STRL (DRAPES) ×2 IMPLANT
DRSG PAD ABDOMINAL 8X10 ST (GAUZE/BANDAGES/DRESSINGS) ×4 IMPLANT
DURAPREP 26ML APPLICATOR (WOUND CARE) ×2 IMPLANT
GAUZE SPONGE 4X4 12PLY STRL (GAUZE/BANDAGES/DRESSINGS) ×2 IMPLANT
GAUZE XEROFORM 1X8 LF (GAUZE/BANDAGES/DRESSINGS) ×2 IMPLANT
GLOVE SURG ENC MOIS LTX SZ7.5 (GLOVE) ×4 IMPLANT
GLOVE SURG UNDER LTX SZ8 (GLOVE) ×4 IMPLANT
GOWN STRL REUS W/TWL XL LVL3 (GOWN DISPOSABLE) ×4 IMPLANT
IV NS IRRIG 3000ML ARTHROMATIC (IV SOLUTION) ×4 IMPLANT
KIT TURNOVER CYSTO (KITS) ×2 IMPLANT
MANIFOLD NEPTUNE II (INSTRUMENTS) ×2 IMPLANT
NS IRRIG 500ML POUR BTL (IV SOLUTION) ×4 IMPLANT
PACK ARTHROSCOPY DSU (CUSTOM PROCEDURE TRAY) ×2 IMPLANT
PACK BASIN DAY SURGERY FS (CUSTOM PROCEDURE TRAY) ×2 IMPLANT
PACK ICE MAXI GEL EZY WRAP (MISCELLANEOUS) ×2 IMPLANT
PAD ARMBOARD 7.5X6 YLW CONV (MISCELLANEOUS) IMPLANT
PAD CAST CTTN 4X4 STRL (SOFTGOODS) ×1 IMPLANT
PADDING CAST COTTON 4X4 STRL (SOFTGOODS) ×2
PORT APPOLLO RF 90DEGREE MULTI (SURGICAL WAND) IMPLANT
STRIP CLOSURE SKIN 1/2X4 (GAUZE/BANDAGES/DRESSINGS) ×2 IMPLANT
SUT ETHILON 4 0 PS 2 18 (SUTURE) ×2 IMPLANT
SYR CONTROL 10ML LL (SYRINGE) IMPLANT
TOWEL OR 17X26 10 PK STRL BLUE (TOWEL DISPOSABLE) ×2 IMPLANT
TUBE CONNECTING 12X1/4 (SUCTIONS) ×2 IMPLANT
TUBING ARTHROSCOPY IRRIG 16FT (MISCELLANEOUS) ×2 IMPLANT

## 2020-06-05 NOTE — Discharge Instructions (Signed)
Post-operative patient instructions  Knee Arthroscopy   . Ice:  Place intermittent ice or cooler pack over your knee, 30 minutes on and 30 minutes off.  Continue this for the first 72 hours after surgery, then save ice for use after therapy sessions or on more active days.   . Weight:  You may bear weight on your leg as your symptoms allow. . DVT prevention: Perform ankle pumps as able throughout the day on the operative extremity.  Be mobile as possible with ambulation as able.  You should also take an 81 mg aspirin once per day x6 weeks. . Crutches:  Use crutches (or walker) to assist in walking until told to discontinue by your physical therapist or physician. This will help to reduce pain. . Strengthening:  Perform simple thigh squeezes (isometric quad contractions) and straight leg lifts as you are able (3 sets of 5 to 10 repetitions, 3 times a day).  For the leg lifts, have someone support under your ankle in the beginning until you have increased strength enough to do this on your own.  To help get started on thigh squeezes, place a pillow under your knee and push down on the pillow with back of knee (sometimes easier to do than with your leg fully straight). . Motion:  Perform gentle knee motion as tolerated - this is gentle bending and straightening of the knee. Seated heel slides: you can start by sitting in a chair, remove your brace, and gently slide your heel back on the floor - allowing your knee to bend. Have someone help you straighten your knee (or use your other leg/foot hooked under your ankle.  . Dressing:  Perform 1st dressing change at 3 days postoperative. A moderate amount of blood tinged drainage is to be expected.  So if you bleed through the dressing on the first or second day or if you have fevers, it is fine to change the dressing/check the wounds early and redress wound. Elevate your leg.  If it bleeds through again, or if the incisions are leaking frank blood, please call the  office. May change dressing every 1-2 days thereafter to help watch wounds. Can purchase Tegderm (or 25M Nexcare) water resistant dressings at local pharmacy / [facility]. . Shower:  Light shower is ok after 3 days.  Please take shower, NO bath. Recover with gauze and ace wrap to help keep wounds protected.   . Pain medication:  A narcotic pain medication has been prescribed.  Take as directed.  Typically you need narcotic pain medication more regularly during the first 3 to 5 days after surgery.  Decrease your use of the medication as the pain improves.  Narcotics can sometimes cause constipation, even after a few doses.  If you have problems with constipation, you can take an over the counter stool softener or light laxative.  If you have persistent problems, please notify your physician's office. Marland Kitchen Physical therapy: Additional activity guidelines to be provided by your physician or physical therapist at follow-up visits.  . Driving: Do not recommend driving x 1-2 weeks post surgical, especially if surgery performed on right side. Should not drive while taking narcotic pain medications. It typically takes at least 2 weeks to restore sufficient neuromuscular function for normal reaction times for driving safety.  . Call [phone] for questions or problems. Evenings you will be forwarded to the hospital operator.  Ask for the orthopaedic physician on call. Please call if you experience:    o Redness, foul  smelling, or persistent drainage from the surgical site  o worsening knee pain and swelling not responsive to medication  o any calf pain and or swelling of the lower leg  o temperatures greater than 101.5 F o other questions or concerns   Thank you for allowing [place] to be a part of your care    Post Anesthesia Home Care Instructions  Activity: Get plenty of rest for the remainder of the day. A responsible individual must stay with you for 24 hours following the procedure.  For the next 24 hours,  DO NOT: -Drive a car -Advertising copywriter -Drink alcoholic beverages -Take any medication unless instructed by your physician -Make any legal decisions or sign important papers.  Meals: Start with liquid foods such as gelatin or soup. Progress to regular foods as tolerated. Avoid greasy, spicy, heavy foods. If nausea and/or vomiting occur, drink only clear liquids until the nausea and/or vomiting subsides. Call your physician if vomiting continues.  Special Instructions/Symptoms: Your throat may feel dry or sore from the anesthesia or the breathing tube placed in your throat during surgery. If this causes discomfort, gargle with warm salt water. The discomfort should disappear within 24 hours.  If you had a scopolamine patch placed behind your ear for the management of post- operative nausea and/or vomiting:  1. The medication in the patch is effective for 72 hours, after which it should be removed.  Wrap patch in a tissue and discard in the trash. Wash hands thoroughly with soap and water. 2. You may remove the patch earlier than 72 hours if you experience unpleasant side effects which may include dry mouth, dizziness or visual disturbances. 3. Avoid touching the patch. Wash your hands with soap and water after contact with the patch.

## 2020-06-05 NOTE — H&P (Signed)
ORTHOPAEDIC H and P  REQUESTING PHYSICIAN: Yolonda Kida, MD  PCP:  [facility]  Chief Complaint:  Left knee pain  HPI: Dawn Rojas is a 50 y.o. female who complains of recalcitrant left knee pain.  She has failed conservative care.  She is here today for diagnostic arthroscopy with limited synovectomy for plica resection.  No new complaints.  Past Medical History:  Diagnosis Date  . Arthritis    right knee and lower back  . COVID 12/2018   asymptomatic  . History of recurrent UTIs    none recent  . IBS (irritable bowel syndrome)   . Intrinsic urethral sphincter deficiency   . Migraine   . Multiple thyroid nodules   . Overactive bladder   . Plica syndrome of left knee   . Urge and stress incontinence   . Varicose veins    Past Surgical History:  Procedure Laterality Date  . ABDOMINAL HYSTERECTOMY  12/1997   partial  . AUGMENTATION MAMMAPLASTY    . BREAST ENHANCEMENT SURGERY    . CESAREAN SECTION  01/25/1988  . PUBOVAGINAL SLING N/A 11/21/2013   Procedure: Leonides Grills;  Surgeon: Anner Crete, MD;  Location: [facility];  Service: Urology;  Laterality: N/A;   Social History   Socioeconomic History  . Marital status: Divorced    Spouse name: Not on file  . Number of children: 4  . Years of education: ged  . Highest education level: Not on file  Occupational History  . Occupation: clean houses    Employer: [employer]  Tobacco Use  . Smoking status: Never Smoker  . Smokeless tobacco: Never Used  Vaping Use  . Vaping Use: Never used  Substance and Sexual Activity  . Alcohol use: No  . Drug use: No  . Sexual activity: Yes  Other Topics Concern  . Not on file  Social History Narrative  . Not on file   Social Determinants of Health   Financial Resource Strain: Not on file  Food Insecurity: Not on file  Transportation Needs: Not on file  Physical Activity: Not on file  Stress: Not on file  Social  Connections: Not on file   Family History  Problem Relation Age of Onset  . Cancer Maternal Grandmother   . Cancer Paternal Grandfather   . Breast cancer Maternal Aunt    Allergies  Allergen Reactions  . Levsin [Hyoscyamine Sulfate]     Rash  . Nitrofuran Derivatives     As a child-unknown reaction macrodantin   Prior to Admission medications   Medication Sig Start Date End Date Taking? Authorizing Provider  gabapentin (NEURONTIN) 600 MG tablet Take 600 mg by mouth at bedtime.   Yes [provider]  ibuprofen (ADVIL) 800 MG tablet Take 800 mg by mouth every 8 (eight) hours as needed.   Yes [provider]  phentermine 37.5 MG capsule Take 37.5 mg by mouth daily.   Yes [provider]  topiramate (TOPAMAX) 25 MG tablet Take 25 mg by mouth 2 (two) times daily.   Yes [provider]  UNABLE TO FIND OXYBUTYNIN HS DOSE UNKNOWN TO PATIENT   Yes [provider]  UNKNOWN TO PATIENT TIZANIDINE AM AND HS DOSE UNKNOWN BY PATIENT   Yes [provider]  aspirin-acetaminophen-caffeine (EXCEDRIN MIGRAINE) 250-250-65 MG per tablet Take 1 tablet by mouth every 6 (six) hours as needed for pain.    [provider]   No results found.  Positive ROS: All other systems have been reviewed and were otherwise negative with the exception of those mentioned in the HPI and as above.  Physical Exam: General: Alert, no acute distress Cardiovascular: No pedal edema Respiratory: No cyanosis, no use of accessory musculature GI: No organomegaly, abdomen is soft and non-tender Skin: No lesions in the area of chief complaint Neurologic: Sensation intact distally Psychiatric: Patient is competent for consent with normal mood and affect Lymphatic: No axillary or cervical lymphadenopathy  MUSCULOSKELETAL:  Left leg is warm and well-perfused.  No open wounds.  No obvious deformity.  Neurovascular intact distal.  Assessment: Left knee plica  syndrome  Plan: -Plan to proceed today with arthroscopic plica resection and limited synovectomy.  We will also look closely at the other tissues and debride as indicated.  Risk-benefit conversation discussed.  Specifically bleeding, infection, damage to surrounding nerves and vessels, stiffness, DVT, and the risk of anesthesia.  She has provided informed consent.  -Plan for discharge home postop.    Yolonda Kida, MD Cell [phone]    06/05/2020 12:31 PM

## 2020-06-05 NOTE — Anesthesia Preprocedure Evaluation (Addendum)
Anesthesia Evaluation  Patient identified by MRN, date of birth, ID band Patient awake    Reviewed: Allergy & Precautions, NPO status , Patient's Chart, lab work & pertinent test results  Airway Mallampati: II  TM Distance: >3 FB Neck ROM: Full    Dental no notable dental hx. (+) Teeth Intact, Dental Advisory Given   Pulmonary neg pulmonary ROS,    Pulmonary exam normal breath sounds clear to auscultation       Cardiovascular negative cardio ROS Normal cardiovascular exam Rhythm:Regular Rate:Normal     Neuro/Psych  Headaches, negative psych ROS   GI/Hepatic negative GI ROS, Neg liver ROS,   Endo/Other  negative endocrine ROS  Renal/GU negative Renal ROS Bladder dysfunction (urge and stress incontinence)      Musculoskeletal  (+) Arthritis , Osteoarthritis,  L knee plica syn   Abdominal   Peds  Hematology negative hematology ROS (+)   Anesthesia Other Findings   Reproductive/Obstetrics negative OB ROS S/p hysterectomy                             Anesthesia Physical Anesthesia Plan  ASA: II  Anesthesia Plan: General   Post-op Pain Management:    Induction: Intravenous  PONV Risk Score and Plan: 3 and Ondansetron, Dexamethasone, Midazolam, Scopolamine patch - Pre-op and Treatment may vary due to age or medical condition  Airway Management Planned: LMA  Additional Equipment: None  Intra-op Plan:   Post-operative Plan: Extubation in OR  Informed Consent: I have reviewed the patients History and Physical, chart, labs and discussed the procedure including the risks, benefits and alternatives for the proposed anesthesia with the patient or authorized representative who has indicated his/her understanding and acceptance.     Dental advisory given  Plan Discussed with: CRNA  Anesthesia Plan Comments:        Anesthesia Quick Evaluation

## 2020-06-05 NOTE — Transfer of Care (Signed)
Immediate Anesthesia Transfer of Care Note  Patient: Dawn Rojas  Procedure(s) Performed: Procedure(s) (LRB): Left knee arthroscopic limited synovectomy (Left)  Patient Location: PACU  Anesthesia Type: General  Level of Consciousness: awake, sedated, patient cooperative and responds to stimulation  Airway & Oxygen Therapy: Patient Spontanous Breathing and Patient connected to NC02 and soft FM   Post-op Assessment: Report given to PACU RN, Post -op Vital signs reviewed and stable and Patient moving all extremities  Post vital signs: Reviewed and stable  Complications: No apparent anesthesia complications

## 2020-06-05 NOTE — Op Note (Signed)
Surgery Date: 06/05/2020  Surgeon(s): Yolonda Kida, MD Station:  PA Sharon Seller was present for the entire procedure.  He participated in all critical steps.  Assistant:  Dion Saucier, PA-C  Assistant  ANESTHESIA:  general, local  FLUIDS: Per anesthesia record.   ESTIMATED BLOOD LOSS: minimal  PRE-OPERATIVE DIAGNOSIS:   1. Left knee plica syndrome  POST-OPERATIVE DIAGNOSIS:  1. Left knee plica syndrome 2.  Left knee lateral meniscus tear, acute. 3.  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  PROCEDURES PERFORMED:  1. Left knee arthroscopy with limited synovectomy 2. Left knee arthroscopy with arthroscopic partial lateral meniscectomy 3. Left knee arthroscopy with arthroscopic chondroplasty medial femoral condyle.   DESCRIPTION OF PROCEDURE: Ms. Sease is a 50 y.o.-year-old female with left knee plica syndrom and possible meniscus tear. Plans are to proceed with partial meniscectomy and diagnostic arthroscopy with debridement as indicated. Full discussion held regarding risks benefits alternatives and complications related surgical intervention. Conservative care options reviewed. All questions answered.  The patient was identified in the preoperative holding area and the operative extremity was marked. The patient was brought to the operating room and transferred to operating table in a supine position. Satisfactory general anesthesia was induced by anesthesiology.    Standard anterolateral, anteromedial arthroscopy portals were obtained. The anteromedial portal was obtained with a spinal needle for localization under direct visualization with subsequent diagnostic findings.   Anteromedial and anterolateral chambers: mild synovitis. The synovitis was debrided with a 4.5 mm full radius shaver through both the anteromedial and lateral portals.  There was an obvious medial shelf plica that was abrading the medial edge of the medial femoral  condyle.  Suprapatellar pouch and gutters: mild synovitis or debris. Patella chondral surface: Grade 0 Trochlear chondral surface: Grade 0 Patellofemoral tracking: Level and midline Medial meniscus: Intact.  Medial femoral condyle flexion bearing surface: Grade 0 Medial femoral condyle extension bearing surface: Grade 3 Medial tibial plateau: Grade 0 Anterior cruciate ligament:stable Posterior cruciate ligament:stable Lateral meniscus: Short radial tear at the junction of the anterior horn and mid body.  This was a white zone tear..   Lateral femoral condyle flexion bearing surface: Grade 0 Lateral femoral condyle extension bearing surface: Grade 0 Lateral tibial plateau: Grade 0  We began the procedure with partial lateral meniscectomy.  Right at the junction of the anterior horn mid body there was a short radial tear.  This was debrided with combination of basket biter as well as motorized shaver.  Contouring was carried back to stable tissue.  Next, while viewing from the lateral portal and working from the medial portal we performed partial/limited anterior synovectomy.  There was an obvious medial shelf plica abrading the medial femoral condyle.  This was taken back to the original capsular tissue.  Chondroplasty was also performed in a debulking fashion with motorized shaver.  This was done on the extension surface of the medial femoral condyle area of grade III chondromalacia.  Likewise performed on the medial edge of the medial femoral condyle where the medial plica had subsequently been resected.  After completion of synovectomy, diagnostic exam, and debridements as described, all compartments were checked and no residual debris remained. Hemostasis was achieved with the cautery wand. The portals were approximated with buried monocryl. All excess fluid was expressed from the joint.  Xeroform sterile gauze dressings were applied followed by Ace bandage and ice pack.   DISPOSITION:  The patient was awakened from general anesthetic, extubated, taken to the recovery room in medically  stable condition, no apparent complications. The patient may be weightbearing as tolerated to the operative lower extremity.  Range of motion of right knee as tolerated.

## 2020-06-05 NOTE — Anesthesia Postprocedure Evaluation (Signed)
Anesthesia Post Note  Patient: Dawn Rojas  Procedure(s) Performed: Left knee arthroscopic limited synovectomy (Left Knee)     Patient location during evaluation: PACU Anesthesia Type: General Level of consciousness: awake and alert, oriented and patient cooperative Pain management: pain level controlled Vital Signs Assessment: post-procedure vital signs reviewed and stable Respiratory status: spontaneous breathing, nonlabored ventilation and respiratory function stable Cardiovascular status: blood pressure returned to baseline and stable Postop Assessment: no apparent nausea or vomiting Anesthetic complications: no   No complications documented.  Last Vitals:  Vitals:   06/05/20 1345 06/05/20 1400  BP: 134/70 96/73  Pulse: 88 64  Resp: 11 11  Temp:    SpO2: 98% 100%    Last Pain:  Vitals:   06/05/20 1415  TempSrc:   PainSc: 3                  Lannie Fields

## 2020-06-05 NOTE — Brief Op Note (Addendum)
06/05/2020  1:24 PM  PATIENT:  Dawn Rojas  50 y.o. female  PRE-OPERATIVE DIAGNOSIS:   1. Left knee plica syndrome  POST-OPERATIVE DIAGNOSIS:  1. Left knee plica syndrome 2.  Left knee lateral meniscus tear, acute. 3.  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   PROCEDURE:  Procedure(s) with comments: Left knee arthroscopic limited synovectomy, left knee partial lateral meniscectomy, left knee chondroplasty of medial femoral condyle (Left) - 1hr  SURGEON:  Surgeon(s) and Role:    * Aundria Rud, Noah Delaine, MD - Primary  PHYSICIAN ASSISTANT: Dion Saucier, PA-C   ANESTHESIA:   local and general  EBL:  10 cc  BLOOD ADMINISTERED:none  DRAINS: none   LOCAL MEDICATIONS USED:  MARCAINE     SPECIMEN:  No Specimen  DISPOSITION OF SPECIMEN:  N/A  COUNTS:  YES  TOURNIQUET:  * Missing tourniquet times found for documented tourniquets in log: [id number] *  DICTATION: .Note written in EPIC  PLAN OF CARE: Discharge to home after PACU  PATIENT DISPOSITION:  PACU - hemodynamically stable.   Delay start of Pharmacological VTE agent (>24hrs) due to surgical blood loss or risk of bleeding: not applicable

## 2020-06-05 NOTE — Anesthesia Procedure Notes (Signed)
Procedure Name: LMA Insertion Date/Time: 06/05/2020 12:54 PM Performed by: Jessica Priest, CRNA Pre-anesthesia Checklist: Patient identified, Emergency Drugs available, Suction available, Patient being monitored and Timeout performed Patient Re-evaluated:Patient Re-evaluated prior to induction Oxygen Delivery Method: Circle system utilized Preoxygenation: Pre-oxygenation with 100% oxygen Induction Type: IV induction Ventilation: Mask ventilation without difficulty LMA: LMA inserted LMA Size: 4.0 Number of attempts: 1 Airway Equipment and Method: Bite block Placement Confirmation: positive ETCO2,  breath sounds checked- equal and bilateral and CO2 detector Tube secured with: Tape Dental Injury: Teeth and Oropharynx as per pre-operative assessment

## 2020-06-08 ENCOUNTER — Encounter (HOSPITAL_BASED_OUTPATIENT_CLINIC_OR_DEPARTMENT_OTHER): Payer: Self-pay | Admitting: Orthopedic Surgery

## 2021-02-25 ENCOUNTER — Other Ambulatory Visit: Payer: Self-pay | Admitting: Physician Assistant

## 2021-02-25 DIAGNOSIS — Z1231 Encounter for screening mammogram for malignant neoplasm of breast: Secondary | ICD-10-CM

## 2021-04-02 ENCOUNTER — Ambulatory Visit: Payer: BC Managed Care – PPO

## 2021-04-09 ENCOUNTER — Ambulatory Visit: Payer: BC Managed Care – PPO

## 2021-04-23 ENCOUNTER — Ambulatory Visit
Admission: RE | Admit: 2021-04-23 | Discharge: 2021-04-23 | Disposition: A | Discharge status: Home / Self Care | Payer: BC Managed Care – PPO | Source: Ambulatory Visit | Attending: Physician Assistant | Admitting: Physician Assistant

## 2021-04-23 ENCOUNTER — Other Ambulatory Visit: Payer: Self-pay | Admitting: Physician Assistant

## 2021-04-23 DIAGNOSIS — Z1231 Encounter for screening mammogram for malignant neoplasm of breast: Secondary | ICD-10-CM

## 2022-05-06 ENCOUNTER — Other Ambulatory Visit: Payer: Self-pay | Admitting: Physician Assistant

## 2022-05-06 DIAGNOSIS — Z1231 Encounter for screening mammogram for malignant neoplasm of breast: Secondary | ICD-10-CM

## 2022-06-17 ENCOUNTER — Ambulatory Visit
Admission: RE | Admit: 2022-06-17 | Discharge: 2022-06-17 | Disposition: A | Discharge status: Home / Self Care | Payer: BC Managed Care – PPO | Source: Ambulatory Visit | Attending: Physician Assistant | Admitting: Physician Assistant

## 2022-06-17 DIAGNOSIS — Z1231 Encounter for screening mammogram for malignant neoplasm of breast: Secondary | ICD-10-CM

## 2023-02-09 IMAGING — MG DIGITAL SCREENING BREAST BILAT IMPLANT W/ TOMO W/ CAD
8 of 14 series · 8 of 34 positions shown · non-contrast
Comparison: Previous exam(s).

CLINICAL DATA: Screening.

EXAM:
DIGITAL SCREENING BILATERAL MAMMOGRAM WITH IMPLANTS, CAD AND
TOMOSYNTHESIS
TECHNIQUE: Bilateral screening digital craniocaudal and mediolateral oblique
mammograms were obtained. Bilateral screening digital breast
tomosynthesis was performed. The images were evaluated with
computer-aided detection. Standard and/or implant displaced views
were performed.

[R CC]
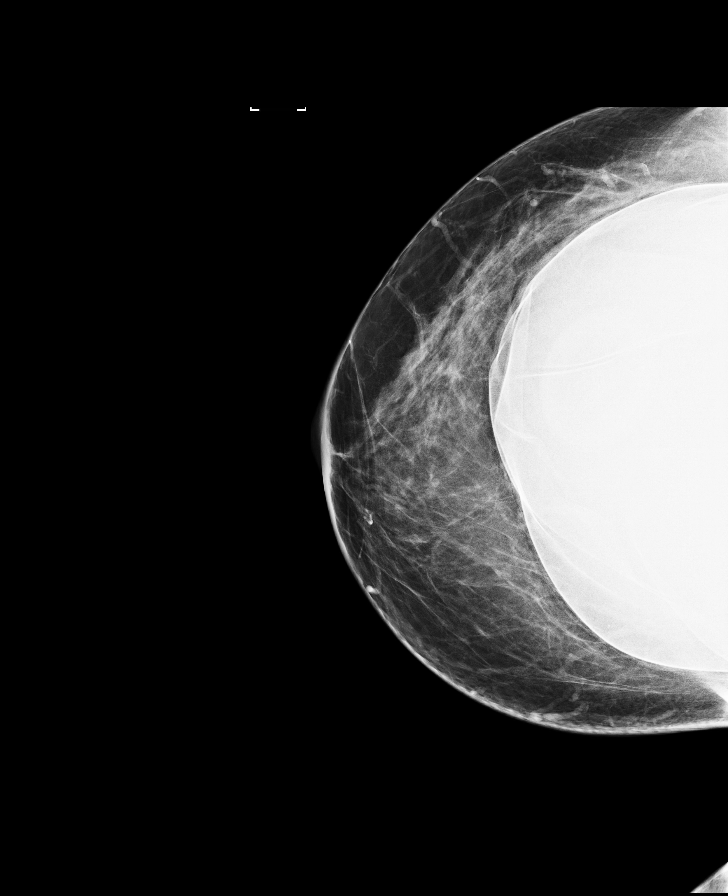

[L CC]
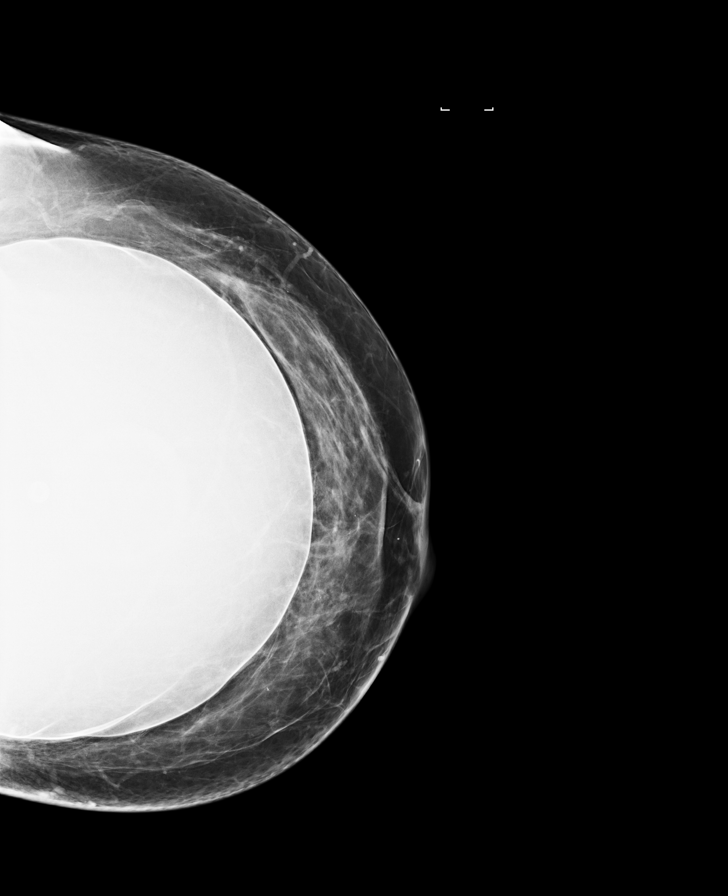

[L MLO]
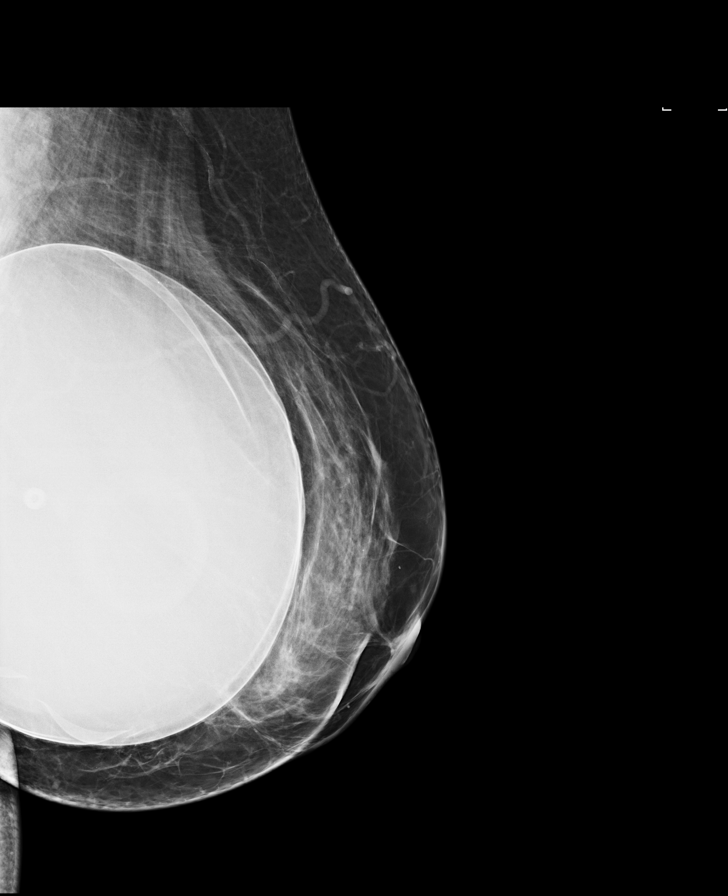

[R MLO]
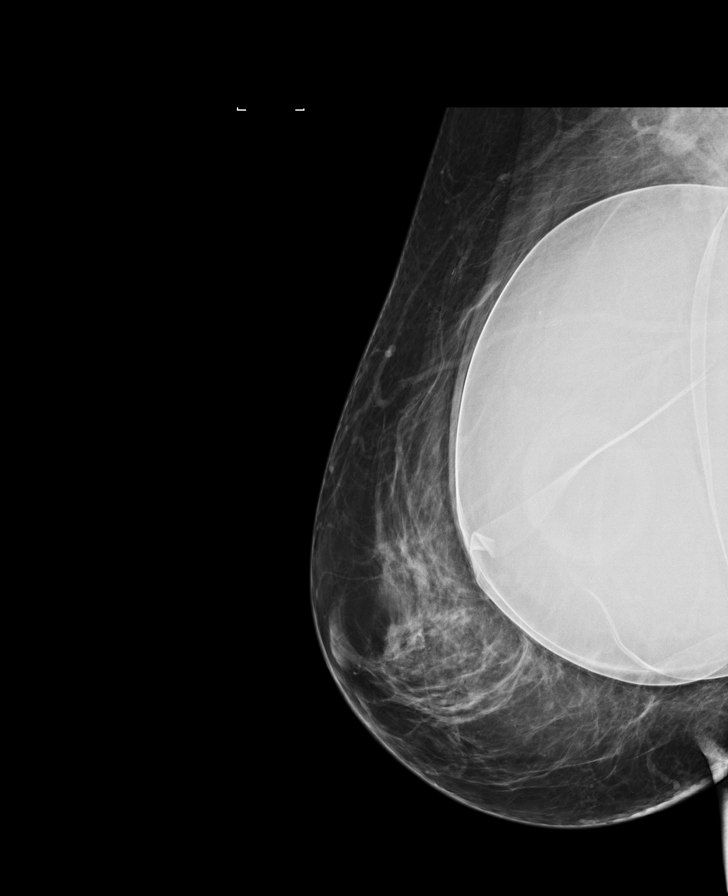

[L CC synth-2D]
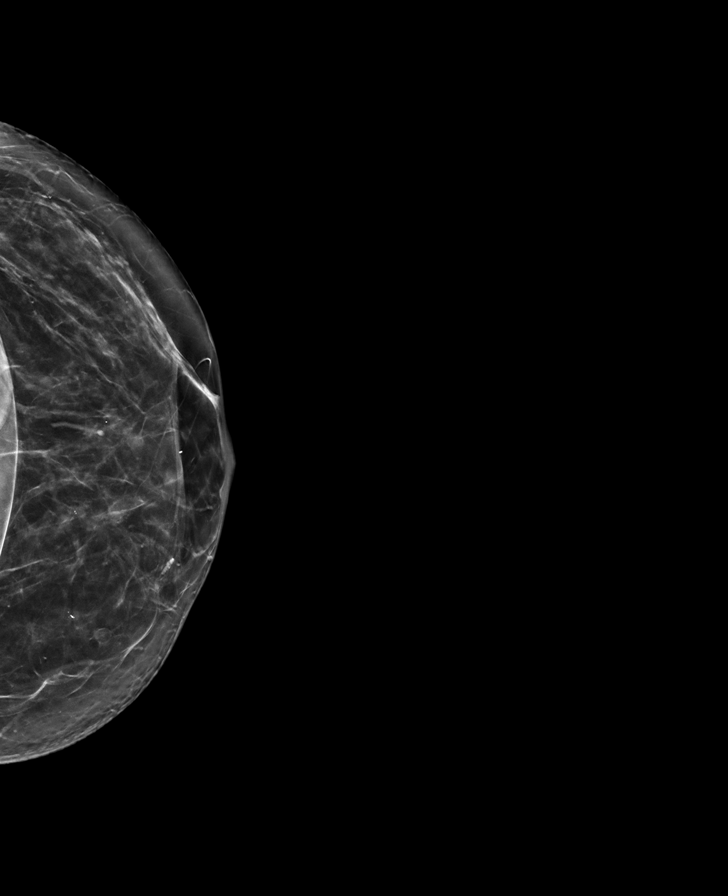

[L MLO synth-2D]
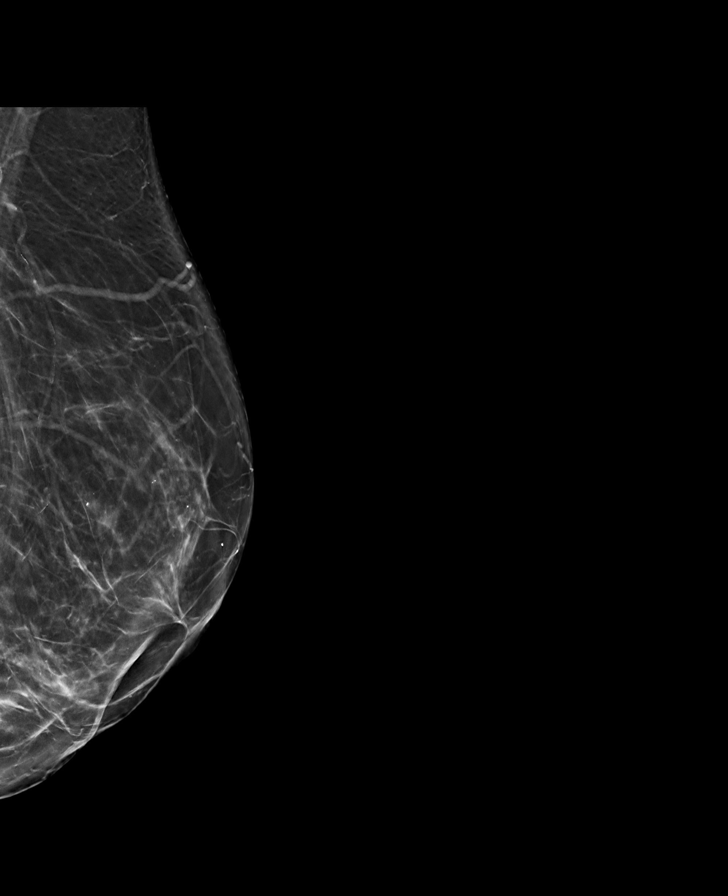

[R CC synth-2D]
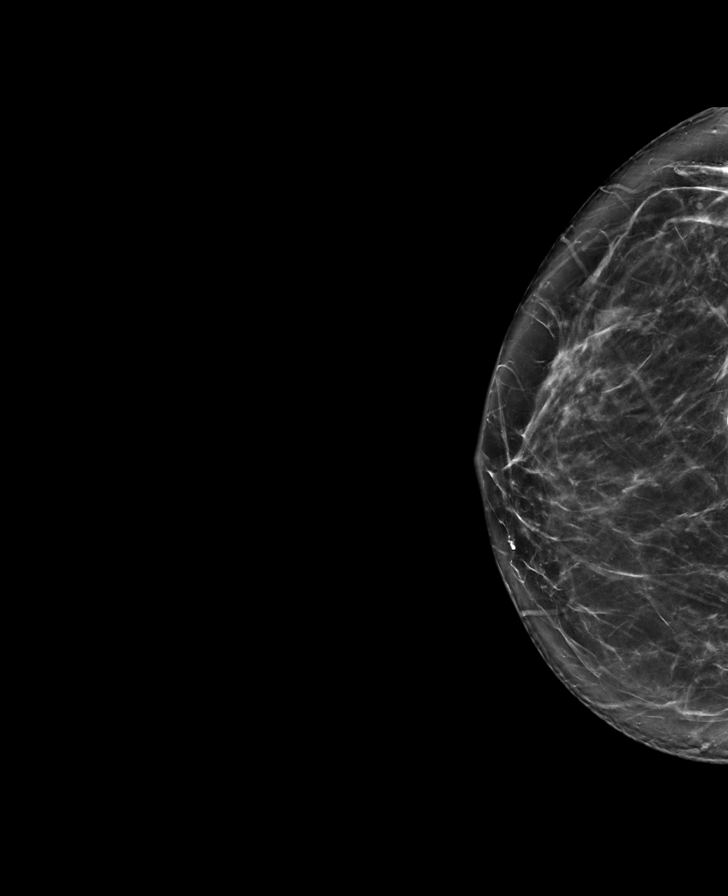

[R MLO synth-2D]
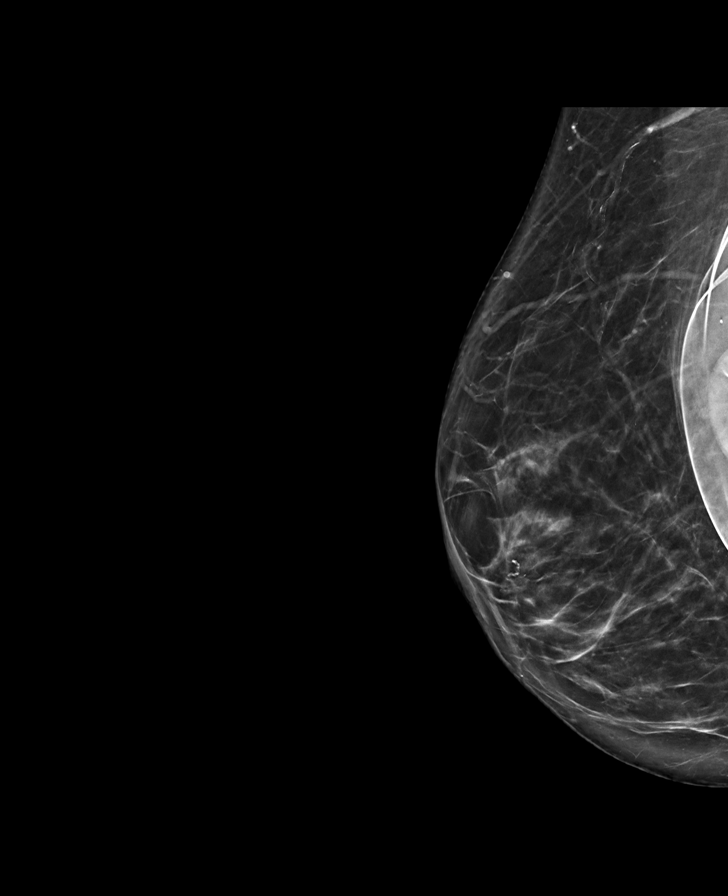

[8 of 34 positions shown; findings below may reference images not displayed]

ACR Breast Density Category b: There are scattered areas of
fibroglandular density.
FINDINGS: The patient has retropectoral implants. There are no findings
suspicious for malignancy.
IMPRESSION: No mammographic evidence of malignancy. A result letter of this
screening mammogram will be mailed directly to the patient.

RECOMMENDATION:
Screening mammogram in one year. (Code:SE-S-JMG)

BI-RADS CATEGORY  1:  Negative.

## 2023-04-06 NOTE — Progress Notes (Signed)
 Patient answered PAT phone call congested, and voice was at a whisper due to illness. Explained to patient that based on anesthesia guidelines they would want her to be symptom free  of any respiratory illness or congestion for two weeks prior to any elective procedure. PAT nurse explained she would let Dr. Aundria Rud office know and that someone would be in touch with her about rescheduling procedure once she is symptom free.

## 2023-04-20 ENCOUNTER — Other Ambulatory Visit: Payer: Self-pay

## 2023-04-20 ENCOUNTER — Encounter (HOSPITAL_BASED_OUTPATIENT_CLINIC_OR_DEPARTMENT_OTHER): Payer: Self-pay | Admitting: Orthopedic Surgery

## 2023-04-28 ENCOUNTER — Ambulatory Visit (HOSPITAL_BASED_OUTPATIENT_CLINIC_OR_DEPARTMENT_OTHER): Admitting: Anesthesiology

## 2023-04-28 ENCOUNTER — Ambulatory Visit (HOSPITAL_BASED_OUTPATIENT_CLINIC_OR_DEPARTMENT_OTHER)
Admission: RE | Admit: 2023-04-28 | Discharge: 2023-04-28 | Disposition: A | Discharge status: Home / Self Care | Payer: BC Managed Care – PPO | Attending: Orthopedic Surgery | Admitting: Orthopedic Surgery

## 2023-04-28 ENCOUNTER — Other Ambulatory Visit: Payer: Self-pay

## 2023-04-28 ENCOUNTER — Encounter (HOSPITAL_BASED_OUTPATIENT_CLINIC_OR_DEPARTMENT_OTHER): Payer: Self-pay | Admitting: Orthopedic Surgery

## 2023-04-28 ENCOUNTER — Encounter (HOSPITAL_BASED_OUTPATIENT_CLINIC_OR_DEPARTMENT_OTHER)
Admission: RE | Disposition: A | Discharge status: Home / Self Care | Payer: Self-pay | Source: Home / Self Care | Attending: Orthopedic Surgery

## 2023-04-28 DIAGNOSIS — M19012 Primary osteoarthritis, left shoulder: Secondary | ICD-10-CM

## 2023-04-28 DIAGNOSIS — X58XXXA Exposure to other specified factors, initial encounter: Secondary | ICD-10-CM | POA: Diagnosis not present

## 2023-04-28 DIAGNOSIS — M7522 Bicipital tendinitis, left shoulder: Secondary | ICD-10-CM | POA: Diagnosis not present

## 2023-04-28 DIAGNOSIS — M25812 Other specified joint disorders, left shoulder: Secondary | ICD-10-CM | POA: Diagnosis not present

## 2023-04-28 DIAGNOSIS — S43432A Superior glenoid labrum lesion of left shoulder, initial encounter: Secondary | ICD-10-CM | POA: Diagnosis present

## 2023-04-28 HISTORY — PX: BICEPT TENODESIS: SHX5116

## 2023-04-28 SURGERY — SHOULDER ARTHROSCOPY WITH SUBACROMIAL DECOMPRESSION AND DISTAL CLAVICLE EXCISION
Anesthesia: General | Site: Shoulder | Laterality: Left

## 2023-04-28 MED ORDER — DEXMEDETOMIDINE HCL IN NACL 80 MCG/20ML IV SOLN
INTRAVENOUS | Status: DC | PRN
  Administered 2023-04-28: 4 ug via INTRAVENOUS

## 2023-04-28 MED ORDER — ONDANSETRON HCL 4 MG/2ML IJ SOLN
INTRAMUSCULAR | Status: AC
  Filled 2023-04-28: qty 2

## 2023-04-28 MED ORDER — ONDANSETRON 4 MG PO TBDP: 4.0000 mg | ORAL_TABLET | Freq: Three times a day (TID) | ORAL | 0 refills | Status: AC | PRN

## 2023-04-28 MED ORDER — LACTATED RINGERS IV SOLN: INTRAVENOUS | Status: DC

## 2023-04-28 MED ORDER — FENTANYL CITRATE (PF) 100 MCG/2ML IJ SOLN: 25.0000 ug | INTRAMUSCULAR | Status: DC | PRN

## 2023-04-28 MED ORDER — SODIUM CHLORIDE 0.9 % IR SOLN
Status: DC | PRN
  Administered 2023-04-28: 6000 mL

## 2023-04-28 MED ORDER — ONDANSETRON HCL 4 MG/2ML IJ SOLN
INTRAMUSCULAR | Status: DC | PRN
  Administered 2023-04-28: 4 mg via INTRAVENOUS

## 2023-04-28 MED ORDER — OXYCODONE HCL 5 MG PO TABS: 5.0000 mg | ORAL_TABLET | Freq: Four times a day (QID) | ORAL | 0 refills | Status: DC | PRN

## 2023-04-28 MED ORDER — ACETAMINOPHEN 500 MG PO TABS
1000.0000 mg | ORAL_TABLET | Freq: Once | ORAL | Status: AC
  Administered 2023-04-28: 1000 mg via ORAL

## 2023-04-28 MED ORDER — BUPIVACAINE LIPOSOME 1.3 % IJ SUSP
INTRAMUSCULAR | Status: DC | PRN
  Administered 2023-04-28: 10 mL via PERINEURAL

## 2023-04-28 MED ORDER — ACETAMINOPHEN 500 MG PO TABS
ORAL_TABLET | ORAL | Status: AC
  Filled 2023-04-28: qty 2

## 2023-04-28 MED ORDER — MIDAZOLAM HCL 2 MG/2ML IJ SOLN
1.0000 mg | Freq: Once | INTRAMUSCULAR | Status: AC
  Administered 2023-04-28: 1 mg via INTRAVENOUS

## 2023-04-28 MED ORDER — LIDOCAINE 2% (20 MG/ML) 5 ML SYRINGE
INTRAMUSCULAR | Status: AC
  Filled 2023-04-28: qty 5

## 2023-04-28 MED ORDER — SUGAMMADEX SODIUM 200 MG/2ML IV SOLN
INTRAVENOUS | Status: DC | PRN
  Administered 2023-04-28: 160 mg via INTRAVENOUS

## 2023-04-28 MED ORDER — CEFAZOLIN SODIUM-DEXTROSE 2-4 GM/100ML-% IV SOLN
2.0000 g | INTRAVENOUS | Status: AC
  Administered 2023-04-28: 2 g via INTRAVENOUS

## 2023-04-28 MED ORDER — ROCURONIUM BROMIDE 100 MG/10ML IV SOLN
INTRAVENOUS | Status: DC | PRN
  Administered 2023-04-28: 40 mg via INTRAVENOUS

## 2023-04-28 MED ORDER — BUPIVACAINE-EPINEPHRINE (PF) 0.5% -1:200000 IJ SOLN
INTRAMUSCULAR | Status: DC | PRN
  Administered 2023-04-28: 15 mL via PERINEURAL

## 2023-04-28 MED ORDER — CEFAZOLIN SODIUM-DEXTROSE 2-4 GM/100ML-% IV SOLN
INTRAVENOUS | Status: AC
  Filled 2023-04-28: qty 100

## 2023-04-28 MED ORDER — FENTANYL CITRATE (PF) 100 MCG/2ML IJ SOLN
INTRAMUSCULAR | Status: AC
  Filled 2023-04-28: qty 2

## 2023-04-28 MED ORDER — LIDOCAINE HCL (CARDIAC) PF 100 MG/5ML IV SOSY
PREFILLED_SYRINGE | INTRAVENOUS | Status: DC | PRN
  Administered 2023-04-28: 40 mg via INTRAVENOUS

## 2023-04-28 MED ORDER — FENTANYL CITRATE (PF) 100 MCG/2ML IJ SOLN
50.0000 ug | Freq: Once | INTRAMUSCULAR | Status: AC
  Administered 2023-04-28: 50 ug via INTRAVENOUS

## 2023-04-28 MED ORDER — MIDAZOLAM HCL 2 MG/2ML IJ SOLN
INTRAMUSCULAR | Status: AC
  Filled 2023-04-28: qty 2

## 2023-04-28 MED ORDER — DEXAMETHASONE SODIUM PHOSPHATE 10 MG/ML IJ SOLN
INTRAMUSCULAR | Status: AC
  Filled 2023-04-28: qty 1

## 2023-04-28 MED ORDER — PROPOFOL 10 MG/ML IV BOLUS
INTRAVENOUS | Status: DC | PRN
  Administered 2023-04-28: 200 mg via INTRAVENOUS

## 2023-04-28 MED ORDER — PROPOFOL 500 MG/50ML IV EMUL
INTRAVENOUS | Status: AC
  Filled 2023-04-28: qty 100

## 2023-04-28 MED ORDER — DROPERIDOL 2.5 MG/ML IJ SOLN: 0.6250 mg | Freq: Once | INTRAMUSCULAR | Status: DC | PRN

## 2023-04-28 SURGICAL SUPPLY — 48 items
BNDG COHESIVE 4X5 TAN STRL LF (GAUZE/BANDAGES/DRESSINGS) IMPLANT
BURR OVAL 8 FLU 4.0X13 (MISCELLANEOUS) IMPLANT
CANNULA 5.75X71 LONG (CANNULA) IMPLANT
CANNULA TWIST IN 8.25X7CM (CANNULA) IMPLANT
COOLER ICEMAN CLASSIC (MISCELLANEOUS) ×1 IMPLANT
CUTTER BONE 4.0MM X 13CM (MISCELLANEOUS) ×1 IMPLANT
DRAPE INCISE IOBAN 66X45 STRL (DRAPES) ×1 IMPLANT
DRAPE STERI 35X30 U-POUCH (DRAPES) ×1 IMPLANT
DRAPE SURG 17X23 STRL (DRAPES) ×1 IMPLANT
DRAPE U-SHAPE 47X51 STRL (DRAPES) ×1 IMPLANT
DRAPE U-SHAPE 76X120 STRL (DRAPES) ×2 IMPLANT
DURAPREP 26ML APPLICATOR (WOUND CARE) ×1 IMPLANT
GAUZE PAD ABD 8X10 STRL (GAUZE/BANDAGES/DRESSINGS) ×2 IMPLANT
GAUZE SPONGE 4X4 12PLY STRL (GAUZE/BANDAGES/DRESSINGS) ×1 IMPLANT
GAUZE XEROFORM 1X8 LF (GAUZE/BANDAGES/DRESSINGS) IMPLANT
GLOVE BIO SURGEON STRL SZ7.5 (GLOVE) ×2 IMPLANT
GLOVE BIOGEL PI IND STRL 8 (GLOVE) ×2 IMPLANT
GOWN STRL REUS W/ TWL LRG LVL3 (GOWN DISPOSABLE) ×1 IMPLANT
GOWN STRL REUS W/TWL XL LVL3 (GOWN DISPOSABLE) ×2 IMPLANT
LOOP 2 FIBERLINK CLOSED (SUTURE) IMPLANT
MANIFOLD NEPTUNE II (INSTRUMENTS) ×1 IMPLANT
NDL HD SCORPION MEGA LOADER (NEEDLE) IMPLANT
NDL SAFETY ECLIPSE 18X1.5 (NEEDLE) ×1 IMPLANT
PACK ARTHROSCOPY DSU (CUSTOM PROCEDURE TRAY) ×1 IMPLANT
PACK BASIN DAY SURGERY FS (CUSTOM PROCEDURE TRAY) ×1 IMPLANT
PAD COLD SHLDR WRAP-ON (PAD) ×1 IMPLANT
PAD ORTHO SHOULDER 7X19 LRG (SOFTGOODS) IMPLANT
SLEEVE ARM SUSPENSION SYSTEM (MISCELLANEOUS) ×1 IMPLANT
SLEEVE SCD COMPRESS KNEE MED (STOCKING) ×1 IMPLANT
SLING ARM FOAM STRAP LRG (SOFTGOODS) IMPLANT
SLING ARM FOAM STRAP MED (SOFTGOODS) IMPLANT
SLING S3 LATERAL DISP (MISCELLANEOUS) IMPLANT
SPIKE FLUID TRANSFER (MISCELLANEOUS) IMPLANT
STRIP CLOSURE SKIN 1/2X4 (GAUZE/BANDAGES/DRESSINGS) ×1 IMPLANT
SUT ETHILON 3 0 PS 1 (SUTURE) IMPLANT
SUT MNCRL AB 3-0 PS2 27 (SUTURE) ×1 IMPLANT
SUT PDS AB 0 CT 36 (SUTURE) IMPLANT
SUT VIC AB 0 CT1 36 (SUTURE) IMPLANT
SUT VIC AB 2-0 CT1 TAPERPNT 27 (SUTURE) IMPLANT
SUTURE TAPE TIGERLINK 1.3MM BL (SUTURE) IMPLANT
SUTURETAPE TIGERLINK 1.3MM BL (SUTURE) IMPLANT
SYR 5ML LL (SYRINGE) ×1 IMPLANT
SYSTEM TENODESIS BC LNT 3.9 (Orthopedic Implant) IMPLANT
TAPE FIBER 2MM 7IN #2 BLUE (SUTURE) IMPLANT
TOWEL GREEN STERILE FF (TOWEL DISPOSABLE) ×1 IMPLANT
TUBE CONNECTING 20X1/4 (TUBING) ×1 IMPLANT
TUBING ARTHROSCOPY IRRIG 16FT (MISCELLANEOUS) ×1 IMPLANT
WAND ABLATOR APOLLO I90 (BUR) ×1 IMPLANT

## 2023-04-28 NOTE — H&P (Signed)
 ORTHOPAEDIC H and P  REQUESTING PHYSICIAN: Yolonda Kida, MD  PCP:  [facility]  Chief Complaint: Left shoulder pain  HPI: Dawn Rojas is a 53 y.o. female who complains of persistent shoulder pain conservative treatment.  Surgery: Left shoulder.  Previously discussed case at length in the office.  No new complaints today.  Past Medical History:  Diagnosis Date   Arthritis    right knee and lower back   COVID 12/2018   asymptomatic   History of recurrent UTIs    none recent   IBS (irritable bowel syndrome)    Intrinsic urethral sphincter deficiency    Migraine    Multiple thyroid nodules    Overactive bladder    Plica syndrome of left knee    Urge and stress incontinence    Varicose veins    Past Surgical History:  Procedure Laterality Date   ABDOMINAL HYSTERECTOMY  12/1997   partial   AUGMENTATION MAMMAPLASTY     BREAST ENHANCEMENT SURGERY     CESAREAN SECTION  01/25/1988   KNEE ARTHROSCOPY Left 06/05/2020   Procedure: Left knee arthroscopic limited synovectomy;  Surgeon: Yolonda Kida, MD;  Location: [facility];  Service: Orthopedics;  Laterality: Left;  1hr   PUBOVAGINAL SLING N/A 11/21/2013   Procedure: Leonides Grills;  Surgeon: Anner Crete, MD;  Location: [facility];  Service: Urology;  Laterality: N/A;   THYROIDECTOMY     Social History   Socioeconomic History   Marital status: Divorced    Spouse name: Not on file   Number of children: 4   Years of education: ged   Highest education level: Not on file  Occupational History   Occupation: clean houses    Employer: [employer]  Tobacco Use   Smoking status: Never   Smokeless tobacco: Never  Vaping Use   Vaping status: Never Used  Substance and Sexual Activity   Alcohol use: No   Drug use: No   Sexual activity: Yes  Other Topics Concern   Not on file  Social History Narrative   Not on file   Social Drivers of Health    Financial Resource Strain: Not on file  Food Insecurity: Not on file  Transportation Needs: Not on file  Physical Activity: Not on file  Stress: Not on file  Social Connections: Unknown (06/07/2021)   Received from [facility], [facility]   Social Network    Social Network: Not on file   Family History  Problem Relation Age of Onset   Cancer Maternal Grandmother    Cancer Paternal Grandfather    Breast cancer Maternal Aunt    Allergies  Allergen Reactions   Levsin [Hyoscyamine Sulfate]     Rash   Prior to Admission medications   Medication Sig Start Date End Date Taking? Authorizing Provider  gabapentin (NEURONTIN) 600 MG tablet Take 600 mg by mouth at bedtime.   Yes [provider]  ibuprofen (ADVIL) 800 MG tablet Take 800 mg by mouth every 8 (eight) hours as needed.   Yes [provider]  levothyroxine (SYNTHROID) 125 MCG tablet Take 125 mcg by mouth daily before breakfast.   Yes [provider]  phentermine 37.5 MG capsule Take 37.5 mg by mouth daily.   Yes [provider]  topiramate (TOPAMAX) 25 MG tablet Take 25 mg by mouth 2 (two) times daily.   Yes [provider]  UNABLE TO FIND OXYBUTYNIN HS DOSE UNKNOWN TO  PATIENT   Yes [provider]  UNKNOWN TO PATIENT TIZANIDINE AM AND HS DOSE UNKNOWN BY PATIENT   Yes [provider]  aspirin-acetaminophen-caffeine (EXCEDRIN MIGRAINE) 250-250-65 MG per tablet Take 1 tablet by mouth every 6 (six) hours as needed for pain.    [provider]  ondansetron (ZOFRAN ODT) 4 MG disintegrating tablet Take 1 tablet (4 mg total) by mouth every 8 (eight) hours as needed for nausea or vomiting. 06/05/20   Yolonda Kida, MD   No results found.  Positive ROS: All other systems have been reviewed and were otherwise negative with the exception of those mentioned in the HPI and as above.  Physical Exam: General: Alert, no acute distress Cardiovascular: No pedal  edema Respiratory: No cyanosis, no use of accessory musculature GI: No organomegaly, abdomen is soft and non-tender Skin: No lesions in the area of chief complaint Neurologic: Sensation intact distally Psychiatric: Patient is competent for consent with normal mood and affect Lymphatic: No axillary or cervical lymphadenopathy  MUSCULOSKELETAL: Left upper extremity is warm and well-perfused with no drainage.  Distally.  Assessment: 1.  Left shoulder acromioclavicular joint arthritis 2.  Left shoulder subacromial impingement 3. left shoulder proximal biceps tendinitis  Plan: Plan to proceed with arthroscopic surgery on the left shoulder.  Previously had discussed with patient for biceps tenodesis as well as subacromial decompression and distal clavicle resection.  Also close look at the rotator cuff.  Proceeding.  We discussed the risk and benefits of the procedure detail which include but are not limited to bleeding, damage to surrounding nerves and vessels, stiffness, recurrent injuries or tears, as well as risks associated with anesthesia.  She has provided informed consent.  Plan for DC home postop PACU.    Yolonda Kida, MD Cell [phone]    04/28/2023 9:21 AM

## 2023-04-28 NOTE — Progress Notes (Signed)
AssistedDr. Rob Fitzgerald with left, interscalene , ultrasound guided block. Side rails up, monitors on throughout procedure. See vital signs in flow sheet. Tolerated Procedure well.  

## 2023-04-28 NOTE — Anesthesia Preprocedure Evaluation (Signed)
 Anesthesia Evaluation  Patient identified by MRN, date of birth, ID band Patient awake    Reviewed: Allergy & Precautions, NPO status , Patient's Chart, lab work & pertinent test results  Airway Mallampati: II  TM Distance: >3 FB Neck ROM: Full    Dental  (+) Dental Advisory Given   Pulmonary neg pulmonary ROS   breath sounds clear to auscultation       Cardiovascular negative cardio ROS  Rhythm:Regular Rate:Normal     Neuro/Psych  Headaches    GI/Hepatic negative GI ROS, Neg liver ROS,,,  Endo/Other  negative endocrine ROS    Renal/GU negative Renal ROS     Musculoskeletal  (+) Arthritis ,    Abdominal   Peds  Hematology negative hematology ROS (+)   Anesthesia Other Findings   Reproductive/Obstetrics                             Anesthesia Physical Anesthesia Plan  ASA: 1  Anesthesia Plan: General   Post-op Pain Management: Regional block* and Tylenol PO (pre-op)*   Induction: Intravenous  PONV Risk Score and Plan: 3 and Dexamethasone, Ondansetron, Midazolam and Treatment may vary due to age or medical condition  Airway Management Planned: Oral ETT  Additional Equipment:   Intra-op Plan:   Post-operative Plan: Extubation in OR  Informed Consent: I have reviewed the patients History and Physical, chart, labs and discussed the procedure including the risks, benefits and alternatives for the proposed anesthesia with the patient or authorized representative who has indicated his/her understanding and acceptance.     Dental advisory given  Plan Discussed with: CRNA  Anesthesia Plan Comments:        Anesthesia Quick Evaluation

## 2023-04-28 NOTE — Discharge Instructions (Addendum)
 Post Anesthesia Home Care Instructions  Activity: Get plenty of rest for the remainder of the day. A responsible individual must stay with you for 24 hours following the procedure.  For the next 24 hours, DO NOT: -Drive a car -Advertising copywriter -Drink alcoholic beverages -Take any medication unless instructed by your physician -Make any legal decisions or sign important papers.  Meals: Start with liquid foods such as gelatin or soup. Progress to regular foods as tolerated. Avoid greasy, spicy, heavy foods. If nausea and/or vomiting occur, drink only clear liquids until the nausea and/or vomiting subsides. Call your physician if vomiting continues.  Special Instructions/Symptoms: Your throat may feel dry or sore from the anesthesia or the breathing tube placed in your throat during surgery. If this causes discomfort, gargle with warm salt water. The discomfort should disappear within 24 hours.  If you had a scopolamine patch placed behind your ear for the management of post- operative nausea and/or vomiting:  1. The medication in the patch is effective for 72 hours, after which it should be removed.  Wrap patch in a tissue and discard in the trash. Wash hands thoroughly with soap and water. 2. You may remove the patch earlier than 72 hours if you experience unpleasant side effects which may include dry mouth, dizziness or visual disturbances. 3. Avoid touching the patch. Wash your hands with soap and water after contact with the patch.   Regional Anesthesia Blocks  1. You may not be able to move or feel the "blocked" extremity after a regional anesthetic block. This may last may last from 3-48 hours after placement, but it will go away. The length of time depends on the medication injected and your individual response to the medication. As the nerves start to wake up, you may experience tingling as the movement and feeling returns to your extremity. If the numbness and inability to move your  extremity has not gone away after 48 hours, please call your surgeon.   2. The extremity that is blocked will need to be protected until the numbness is gone and the strength has returned. Because you cannot feel it, you will need to take extra care to avoid injury. Because it may be weak, you may have difficulty moving it or using it. You may not know what position it is in without looking at it while the block is in effect.  3. For blocks in the legs and feet, returning to weight bearing and walking needs to be done carefully. You will need to wait until the numbness is entirely gone and the strength has returned. You should be able to move your leg and foot normally before you try and bear weight or walk. You will need someone to be with you when you first try to ensure you do not fall and possibly risk injury.  4. Bruising and tenderness at the needle site are common side effects and will resolve in a few days.  5. Persistent numbness or new problems with movement should be communicated to the surgeon or the [facility] [phone] [facility] [phone])  NO TYLENOL PRODUCTS UNTIL AFTER 4pm    Orthopedic surgery discharge instructions:  -Maintain postoperative bandages for 3 days.  You may remove these bandages on post op day 3 and begin showering at that time.  Please do not submerge underwater.  -Maintain your arm in sling at all times.  You should only remove for showering and getting dressed.  No lifting with  the operative arm.  -For mild to moderate pain use Tylenol and Advil in alternating fashion around-the-clock.  For breakthrough pain use oxycodone as necessary.  -Please apply ice to the right shoulder for 20-30 minutes out of each hour that you are awake.  Do this around-the-clock for the first 3 days from surgery.  -Follow-up in 2 weeks for routine postoperative check.    Post Anesthesia Home Care Instructions  Activity: Get plenty of  rest for the remainder of the day. A responsible individual must stay with you for 24 hours following the procedure.  For the next 24 hours, DO NOT: -Drive a car -Advertising copywriter -Drink alcoholic beverages -Take any medication unless instructed by your physician -Make any legal decisions or sign important papers.  Meals: Start with liquid foods such as gelatin or soup. Progress to regular foods as tolerated. Avoid greasy, spicy, heavy foods. If nausea and/or vomiting occur, drink only clear liquids until the nausea and/or vomiting subsides. Call your physician if vomiting continues.  Special Instructions/Symptoms: Your throat may feel dry or sore from the anesthesia or the breathing tube placed in your throat during surgery. If this causes discomfort, gargle with warm salt water. The discomfort should disappear within 24 hours.  If you had a scopolamine patch placed behind your ear for the management of post- operative nausea and/or vomiting:  1. The medication in the patch is effective for 72 hours, after which it should be removed.  Wrap patch in a tissue and discard in the trash. Wash hands thoroughly with soap and water. 2. You may remove the patch earlier than 72 hours if you experience unpleasant side effects which may include dry mouth, dizziness or visual disturbances. 3. Avoid touching the patch. Wash your hands with soap and water after contact with the patch.  Information for Discharge Teaching: EXPAREL (bupivacaine liposome injectable suspension)   Pain relief is important to your recovery. The goal is to control your pain so you can move easier and return to your normal activities as soon as possible after your procedure. Your physician may use several types of medicines to manage pain, swelling, and more.  Your surgeon or anesthesiologist gave you EXPAREL(bupivacaine) to help control your pain after surgery.  EXPAREL is a local anesthetic designed to release slowly over an  extended period of time to provide pain relief by numbing the tissue around the surgical site. EXPAREL is designed to release pain medication over time and can control pain for up to 72 hours. Depending on how you respond to EXPAREL, you may require less pain medication during your recovery. EXPAREL can help reduce or eliminate the need for opioids during the first few days after surgery when pain relief is needed the most. EXPAREL is not an opioid and is not addictive. It does not cause sleepiness or sedation.   Important! A teal colored band has been placed on your arm with the date, time and amount of EXPAREL you have received. Please leave this armband in place for the full 96 hours following administration, and then you may remove the band. If you return to the hospital for any reason within 96 hours following the administration of EXPAREL, the armband provides important information that your health care providers to know, and alerts them that you have received this anesthetic.    Possible side effects of EXPAREL: Temporary loss of sensation or ability to move in the area where medication was injected. Nausea, vomiting, constipation Rarely, numbness and tingling in your  mouth or lips, lightheadedness, or anxiety may occur. Call your doctor right away if you think you may be experiencing any of these sensations, or if you have other questions regarding possible side effects.  Follow all other discharge instructions given to you by your surgeon or nurse. Eat a healthy diet and drink plenty of water or other fluids.  No tylenol today until after 3:46pm if needed.

## 2023-04-28 NOTE — Anesthesia Procedure Notes (Signed)
 Procedure Name: Intubation Date/Time: 04/28/2023 11:27 AM  Performed by: Earmon Phoenix, CRNAPre-anesthesia Checklist: Patient identified, Emergency Drugs available, Suction available, Patient being monitored and Timeout performed Patient Re-evaluated:Patient Re-evaluated prior to induction Oxygen Delivery Method: Circle system utilized Preoxygenation: Pre-oxygenation with 100% oxygen Induction Type: IV induction Ventilation: Mask ventilation without difficulty Laryngoscope Size: Mac and 3 Grade View: Grade III Tube type: Oral Tube size: 7.0 mm Number of attempts: 1 Airway Equipment and Method: Stylet Placement Confirmation: ETT inserted through vocal cords under direct vision, positive ETCO2 and breath sounds checked- equal and bilateral Secured at: 22 cm Tube secured with: Tape Dental Injury: Teeth and Oropharynx as per pre-operative assessment

## 2023-04-28 NOTE — Anesthesia Postprocedure Evaluation (Signed)
 Anesthesia Post Note  Patient: Dawn Rojas  Procedure(s) Performed: LEFT SHOULDER ARTHROSCOPY, DISTAL CLAVICLE RESECTION, SUBACROMIAL DECOMPRESSION, BICEPS TENODESIS (Left: Shoulder) TENODESIS, BICEPS (Left: Shoulder)     Patient location during evaluation: PACU Anesthesia Type: General Level of consciousness: awake and alert Pain management: pain level controlled Vital Signs Assessment: post-procedure vital signs reviewed and stable Respiratory status: spontaneous breathing, nonlabored ventilation, respiratory function stable and patient connected to nasal cannula oxygen Cardiovascular status: blood pressure returned to baseline and stable Postop Assessment: no apparent nausea or vomiting Anesthetic complications: no  No notable events documented.  Last Vitals:  Vitals:   04/28/23 1300 04/28/23 1315  BP: 134/77 (!) 136/91  Pulse: 78 67  Resp: 18 16  Temp:  36.8 C  SpO2: 100% 98%    Last Pain:  Vitals:   04/28/23 1315  TempSrc:   PainSc: 0-No pain                 Kennieth Rad

## 2023-04-28 NOTE — Transfer of Care (Signed)
 Immediate Anesthesia Transfer of Care Note  Patient: Dawn Rojas  Procedure(s) Performed: LEFT SHOULDER ARTHROSCOPY, DISTAL CLAVICLE RESECTION, SUBACROMIAL DECOMPRESSION, BICEPS TENODESIS (Left: Shoulder) TENODESIS, BICEPS (Left: Shoulder)  Patient Location: PACU  Anesthesia Type:General  Level of Consciousness: awake and patient cooperative  Airway & Oxygen Therapy: Patient Spontanous Breathing and Patient connected to nasal cannula oxygen  Post-op Assessment: Report given to RN and Post -op Vital signs reviewed and stable  Post vital signs: Reviewed and stable  Last Vitals:  Vitals Value Taken Time  BP 130/81 04/28/23 1223  Temp    Pulse 73 04/28/23 1225  Resp 14 04/28/23 1225  SpO2 100 % 04/28/23 1225  Vitals shown include unfiled device data.  Last Pain:  Vitals:   04/28/23 0944  TempSrc: Temporal  PainSc: 0-No pain         Complications: No notable events documented.

## 2023-04-28 NOTE — Anesthesia Procedure Notes (Signed)
 Anesthesia Regional Block: Interscalene brachial plexus block   Pre-Anesthetic Checklist: , timeout performed,  Correct Patient, Correct Site, Correct Laterality,  Correct Procedure, Correct Position, site marked,  Risks and benefits discussed,  Surgical consent,  Pre-op evaluation,  At surgeon's request and post-op pain management  Laterality: Left  Prep: chloraprep       Needles:  Injection technique: Single-shot  Needle Type: Echogenic Stimulator Needle     Needle Length: 9cm  Needle Gauge: 21     Additional Needles:   Procedures:,,,, ultrasound used (permanent image in chart),,     Nerve Stimulator or Paresthesia:  Response: deltoid and bicep, 0.5 mA  Additional Responses:   Narrative:  Start time: 04/28/2023 10:18 AM End time: 04/28/2023 10:25 AM Injection made incrementally with aspirations every 5 mL.  Performed by: Personally  Anesthesiologist: Marcene Duos, MD

## 2023-04-28 NOTE — Op Note (Signed)
 Date of Surgery: 04/28/2023  INDICATIONS: Dawn Rojas is a 53 y.o.-year-old female with a left shoulder symptomatic SLAP tear as well as symptomatic acromioclavicular arthritis.  Here today for prescription surgery.;  The patient did consent to the procedure after discussion of the risks and benefits.  PREOPERATIVE DIAGNOSIS:  1.  Left shoulder subacromial impingement 2.  Left shoulder proximal biceps tendinitis 3.  Left shoulder type II SLAP tear 4.  Left shoulder symptomatic acromioclavicular joint arthritis  POSTOPERATIVE DIAGNOSIS: Same.  PROCEDURE:  1.  Left shoulder arthroscopic biceps tenodesis 2.  Left shoulder arthroscopic subacromial decompression 3.  Left shoulder arthroscopic distal clavicle resection (1 cm resected distal clavicle bone).  SURGEON: Maryan Rued, M.D.  ASSIST: Dion Saucier, PA-C  ANESTHESIA:  general, interscalene block with Exparel  IV FLUIDS AND URINE: See anesthesia.  ESTIMATED BLOOD LOSS: 10 mL.  IMPLANTS: Arthrex 3.9 mm swivel lock anchor for biceps tenodesis with Arthrex fiber link suture  DRAINS: None  COMPLICATIONS: None.  DESCRIPTION OF PROCEDURE: The patient was brought to the operating room and placed supine on the operating table.  The patient had been signed prior to the procedure and this was documented. The patient had the anesthesia placed by the anesthesiologist.  A time-out was performed to confirm that this was the correct patient, site, side and location. The patient did receive antibiotics prior to the incision and was re-dosed during the procedure as needed at indicated intervals.  A tourniquet was not placed.  The patient had the operative extremity prepped and draped in the standard surgical fashion.      After obtaining informed consent the patient was brought to the operating table and underwent satisfactory anesthesia. An exam under anesthesia revealed full range of motion. She was placed in the right lateral decubitus  position with an axillary roll and all bony prominences properly padded. A standard surgical timeout was performed. She was placed in 10 pounds of gentle in-line suspension.  Standard posterior and anterior superior portals were established. A diagnostic evaluation of the glenohumeral joint was performed. The biceps tendon was markedly synovitic, but not torn.The articular surfaces revealed no significant chondromalacia . The subscapularis was intact. The rotator cuff was also intact including the supraspinatus, infraspinatus, and teres minor.  There was an unstable type II SLAP tear of the superior labrum which did propagate back to about the 3 o'clock position in this left shoulder.  No loose bodies in the joint.  After our intra-articular diagnostic arthroscopy we went ahead and proceeded with a biceps at least.  We utilized an Arthrex loop and tack method.  First established our mid glenoid portal just into the rotator interval.  We cleared rotator interval tissue that was obscuring our view with motorized shaver and radiofrequency wand.  We then placed a luggage tag suture around the mid articular portion of the long head of the biceps.  We then with an antegrade suture passer, pierced the tendon midsubstance blocked that suture in place.  We then loaded the free end of the suture into a 3.0 L lock anchor.  A pilot hole was placed just above the subscapularis hide in the biceps groove.  We then placed the anchor with malleted into that pilot hole and secured the biceps back to the biceps groove at that location.  Suture tails cut flush.  The arthroscope was inserted in the subacromial space and an additional lateral portal was established. An acromioplasty performed nicely decompressing the subacromial space with a motorized burr. Bursitis  in the subacromial space was removed as well as releases were performed on the bursal surface of the rotator cuff.  This change the morphology of the acromion from  a type II to a type I.   Lastly, we proceeded with the distal clavicle resection.  While viewing from the lateral portal and working glenoid anterior portal we introduced the radiofrequency wand to the [facility] joint.  This was very sclerotic and undersurface spur.  Once we removed the inferior anterior capsular it was introduced the motorized bur into the [facility] joint.  We resected 1 cm of distal clavicle.  Care was taken to preserve superior posterior capsule.  The arthroscope was then removed and portals closed with 3-0 Monocryl in standard fashion followed by a sterile occlusive dressing Polar Care ice sleeve and a slingshot sling. The patient was sent to recovery in stable condition and tolerated the procedure well  POSTOPERATIVE PLAN:  She will discharge home today from PACU.  Should be in her sling for 2 weeks on the biceps tenodesis protocol.  No lifting greater than 2 pounds for activities of daily living at this point in time with the left arm.

## 2023-04-28 NOTE — Brief Op Note (Signed)
 04/28/2023  12:11 PM  PATIENT:  Dawn Rojas  53 y.o. female  PRE-OPERATIVE DIAGNOSIS:  Left shoulder biceps tendonitis, impingement acromio clavicular osteoarthritis  POST-OPERATIVE DIAGNOSIS:  Left shoulder biceps tendonitis, impingement acromio clavicular osteoarthritis  PROCEDURE:  Procedure(s): LEFT SHOULDER ARTHROSCOPY, DISTAL CLAVICLE RESECTION, SUBACROMIAL DECOMPRESSION, BICEPS TENODESIS (Left) TENODESIS, BICEPS (Left)  SURGEON:  Surgeons and Role:    * Yolonda Kida, MD - Primary  PHYSICIAN ASSISTANT: Dion Saucier, PA-C   ANESTHESIA:   regional and general  EBL: 10 cc  BLOOD ADMINISTERED:none  DRAINS: none   LOCAL MEDICATIONS USED:  NONE  SPECIMEN:  No Specimen  DISPOSITION OF SPECIMEN:  N/A  COUNTS:  YES  TOURNIQUET:  * No tourniquets in log *  DICTATION: .Note written in EPIC  PLAN OF CARE: Discharge to home after PACU  PATIENT DISPOSITION:  PACU - hemodynamically stable.   Delay start of Pharmacological VTE agent (>24hrs) due to surgical blood loss or risk of bleeding: not applicable

## 2023-05-01 ENCOUNTER — Encounter (HOSPITAL_BASED_OUTPATIENT_CLINIC_OR_DEPARTMENT_OTHER): Payer: Self-pay | Admitting: Orthopedic Surgery

## 2023-07-12 ENCOUNTER — Other Ambulatory Visit: Payer: Self-pay | Admitting: Nurse Practitioner

## 2023-07-12 DIAGNOSIS — Z1231 Encounter for screening mammogram for malignant neoplasm of breast: Secondary | ICD-10-CM

## 2023-08-18 ENCOUNTER — Ambulatory Visit
Admission: RE | Admit: 2023-08-18 | Discharge: 2023-08-18 | Disposition: A | Discharge status: Home / Self Care | Source: Ambulatory Visit | Attending: Nurse Practitioner | Admitting: Nurse Practitioner

## 2023-08-18 DIAGNOSIS — Z1231 Encounter for screening mammogram for malignant neoplasm of breast: Secondary | ICD-10-CM

## 2023-09-19 ENCOUNTER — Other Ambulatory Visit: Payer: Self-pay | Admitting: Urology

## 2023-10-17 NOTE — Patient Instructions (Signed)
 SURGICAL WAITING ROOM VISITATION  Patients having surgery or a procedure may have no more than 2 support people in the waiting area - these visitors may rotate.    Children under the age of 33 must have an adult with them who is not the patient.  Visitors with respiratory illnesses are discouraged from visiting and should remain at home.  If the patient needs to stay at the hospital during part of their recovery, the visitor guidelines for inpatient rooms apply. Pre-op nurse will coordinate an appropriate time for 1 support person to accompany patient in pre-op.  This support person may not rotate.    Please refer to the [facility] website for the visitor guidelines for Inpatients (after your surgery is over and you are in a regular room).       Your procedure is scheduled on:  11/03/2023    Report to [facility] Main Entrance    Report to admitting at   0700AM   Call this number if you have problems the morning of surgery [phone]   Do not eat food or drink liquids  :After Midnight.                                If you have questions, please contact your surgeon's office.      Oral Hygiene is also important to reduce your risk of infection.                                    Remember - BRUSH YOUR TEETH THE MORNING OF SURGERY WITH YOUR REGULAR TOOTHPASTE  DENTURES WILL BE REMOVED PRIOR TO SURGERY PLEASE DO NOT APPLY Poly grip OR ADHESIVES!!!   Do NOT smoke after Midnight   Stop all vitamins and herbal supplements 7 days before surgery.   Take these medicines the morning of surgery with A SIP OF WATER :  synthroid, topamax, oxybutynin   DO NOT TAKE ANY ORAL DIABETIC MEDICATIONS DAY OF YOUR SURGERY  Bring CPAP mask and tubing day of surgery.                              You may not have any metal on your body including hair pins, jewelry, and body piercing             Do not wear make-up, lotions, powders, perfumes/cologne, or deodorant  Do not wear  nail polish including gel and S&S, artificial/acrylic nails, or any other type of covering on natural nails including finger and toenails. If you have artificial nails, gel coating, etc. that needs to be removed by a nail salon please have this removed prior to surgery or surgery may need to be canceled/ delayed if the surgeon/ anesthesia feels like they are unable to be safely monitored.   Do not shave  48 hours prior to surgery.               Men may shave face and neck.   Do not bring valuables to the hospital. [place] IS NOT             RESPONSIBLE   FOR VALUABLES.   Contacts, glasses, dentures or bridgework may not be worn into surgery.   Bring small overnight bag day of surgery.   DO NOT [facility] HOME MEDICATIONS  TO THE HOSPITAL. PHARMACY WILL DISPENSE MEDICATIONS LISTED ON YOUR MEDICATION LIST TO YOU DURING YOUR ADMISSION IN THE HOSPITAL!    Patients discharged on the day of surgery will not be allowed to drive home.  Someone NEEDS to stay with you for the first 24 hours after anesthesia.   Special Instructions: Bring a copy of your healthcare power of attorney and living will documents the day of surgery if you haven't scanned them before.              Please read over the following fact sheets you were given: IF YOU HAVE QUESTIONS ABOUT YOUR PRE-OP INSTRUCTIONS PLEASE CALL [phone].   If you received a COVID test during your pre-op visit  it is requested that you wear a mask when out in public, stay away from anyone that may not be feeling well and notify your surgeon if you develop symptoms. If you test positive for Covid or have been in contact with anyone that has tested positive in the last 10 days please notify you surgeon.    [place] - Preparing for Surgery Before surgery, you can play an important role.  Because skin is not sterile, your skin needs to be as free of germs as possible.  You can reduce the number of germs on your skin by washing with CHG  (chlorahexidine gluconate) soap before surgery.  CHG is an antiseptic cleaner which kills germs and bonds with the skin to continue killing germs even after washing. Please DO NOT use if you have an allergy to CHG or antibacterial soaps.  If your skin becomes reddened/irritated stop using the CHG and inform your nurse when you arrive at Short Stay. Do not shave (including legs and underarms) for at least 48 hours prior to the first CHG shower.  You may shave your face/neck. Please follow these instructions carefully:  1.  Shower with CHG Soap the night before surgery and the  morning of Surgery.  2.  If you choose to wash your hair, wash your hair first as usual with your  normal  shampoo.  3.  After you shampoo, rinse your hair and body thoroughly to remove the  shampoo.                           4.  Use CHG as you would any other liquid soap.  You can apply chg directly  to the skin and wash                       Gently with a scrungie or clean washcloth.  5.  Apply the CHG Soap to your body ONLY FROM THE NECK DOWN.   Do not use on face/ open                           Wound or open sores. Avoid contact with eyes, ears mouth and genitals (private parts).                       Wash face,  Genitals (private parts) with your normal soap.             6.  Wash thoroughly, paying special attention to the area where your surgery  will be performed.  7.  Thoroughly rinse your body with warm water  from the neck down.  8.  DO NOT shower/wash with your  normal soap after using and rinsing off  the CHG Soap.                9.  Pat yourself dry with a clean towel.            10.  Wear clean pajamas.            11.  Place clean sheets on your bed the night of your first shower and do not  sleep with pets. Day of Surgery : Do not apply any lotions/deodorants the morning of surgery.  Please wear clean clothes to the hospital/surgery center.  FAILURE TO FOLLOW THESE INSTRUCTIONS MAY RESULT IN THE CANCELLATION OF  YOUR SURGERY PATIENT SIGNATURE_________________________________  NURSE SIGNATURE__________________________________  ________________________________________________________________________

## 2023-10-17 NOTE — Progress Notes (Signed)
 Anesthesia Review:  PCP: Cardiologist :  PPM/ ICD: Device Orders: Rep Notified:  Chest x-ray : EKG : Echo : Stress test: Cardiac Cath :   Activity level:  Sleep Study/ CPAP : Fasting Blood Sugar :      / Checks Blood Sugar -- times a day:    Blood Thinner/ Instructions /Last Dose: ASA / Instructions/ Last Dose :    Phentermine - lst dose on 10/12/2023 [facility]- pt has not had in a month per pt  To have cataract surgery on left eye on 11/07/23.     Phone appt t on 10/18/23 PT was supposed to come in for preop on 10/18/23 at 100pm.  When pt was not here called pt and she stated she had cancelled preop appt but was still ahving surgery. On 11/03/23.  Completed med hx and preop instructions via phone.  Labs to be done am of surgery.

## 2023-10-18 ENCOUNTER — Other Ambulatory Visit (HOSPITAL_COMMUNITY)

## 2023-10-18 ENCOUNTER — Encounter (HOSPITAL_COMMUNITY): Payer: Self-pay

## 2023-10-18 ENCOUNTER — Encounter (HOSPITAL_COMMUNITY)
Admission: RE | Admit: 2023-10-18 | Discharge: 2023-10-18 | Disposition: A | Discharge status: Home / Self Care | Source: Ambulatory Visit | Attending: Urology | Admitting: Urology

## 2023-10-18 ENCOUNTER — Other Ambulatory Visit: Payer: Self-pay

## 2023-10-18 DIAGNOSIS — Z01818 Encounter for other preprocedural examination: Secondary | ICD-10-CM

## 2023-10-18 HISTORY — DX: Personal history of urinary calculi: Z87.442

## 2023-10-31 NOTE — H&P (Signed)
 CC: History of UTI's.   Hx: Dawn Rojas is a 53 yo female who I have seen in the past for incontinence. She has had a sling by me several years ago in about 2015. She returns now with a 3 month history of bladder pain and frequency. She has had several negative urines but a culture wasn't done until the last couple of days and it was positive and macrobid was sent but she hasn't had a chance to pick it up yet. She has constant suprapubic pain and urgency and frequency. She has pain after sexual activity. She has had some bowel seepage when she voids. She has IBS. She has had no gross hematuria but can have some blood when she wipes. She had a pelvic with her last physical and she was sent to a Gyn a few months ago and nothing was found to be wrong. She had a hysterectomy over 10 years ago. She has had no prolapse symptoms. The Azo does stop the pain and frequency. Her UA today has epis. She has a history of MUI and has seen PT here in the past and still does the exercises.   Her culture from [place] grew >100000 colonies of klebsiella but also 10-25K of mx flora. It is sens to cephalosporins, NF and bactrim  as well as quinolones and augmentin.    Interval 10/09/19: Patient with above-noted history. Urine culture at prior office visit was noted to be positive for strep viridans. Patient states there was some confusion surrounding her prescription for Augmentin. Therefore, she states that she completed treatment course of antibiotic therapy prescribed by her primary care provider and also completed course of Bactrim . She states that currently symptoms have improved following treatment course of therapy. She continues to complain of some mild lower back pain. She states that suprapubic pain/pressure and voiding symptoms have improved. She currently feels she is voiding at baseline. She does have some frequency and urgency at baseline. She does use oxybutynin for management. She denies any current dysuria. No  complaints of gross hematuria. She denies fever, chills, nausea, or vomiting. No complaints of unilateral flank pain. She has used some Pyridium on p.r.n. basis since prior office visit, which is helpful.   1/17/25[name] returns today with recurrent UTI's and pain with voiding. In the last 6 months she has had 8-9 symptomatic episodes that sometimes resolve with hydration and Azo but she has had 5-6 rounds of antibioitics in the last 6 months. She is taking oxybutynin at bedtime for nocturia. She has nocturia x 1 on the med. She has had no gross hematuria. She has had a prior sling in 2015 but has no history of stones. She had a renal US  that showed a right renal cyst and mild left hydro. She had a CT after that that showed small 1-88mm bilateral renal stones and a calcified adrenal nodule. She had no hydro but had left peripelvic cysts. She has chronic low back pain. She has not been on an antibiotic in a couple of months. Her UA today has 6-10 WBC, 3-10 RBC's and a few bacteria.   7/8/25[name] returns today in f/u for her hsitory of microhematuria and recurrent UTI's for cystoscopy. Her UA is clear today. She has had several infections with frequent small voids and burning with urination. She has a burning sensation when she just sits there.   8/12/2025[name] presents today for concerns of suprapubic pain and pressure. At last office visit she was started on Gemtesa. She  has noticed that it has reduced her  frequency of urination. She has not had as much suprapubic pain. She does have nocturia 1-2 times and is still oxybutynin as well. She has this low back pain that she describes as dull and aching. The only thing that makes it better is laying down. Nothing makes it worse.   10/04/2023: Dawn Rojas presents today for follow-up of bladder pain. Her CT scan shows no concerning renal or ureter findings. She does have a adrenal lesion that needs to be followed up on in 1 year. 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 She has surgery scheduled for hydrodistention on 11/03/2023. She is looking forward to this. Her urine is clear today.     ALLERGIES: Levsin - Skin Rash    MEDICATIONS: Azo 1 PO Daily  Estradiol 0.1 MG/GM Cream apply 0.5gm or a pea sized amount vaginally at bedtime for 2 weeks and then 2-3x weekly.  Gabapentin 600 MG Tablet 1 tablet PO Daily  oxyBUTYnin Chloride ER 10 MG Tablet Extended Release 24 Hour 1 tablet PO Daily  Phentermine HCl 37.5 MG Capsule  Synthroid  Tizanidine Hcl 1 PO Daily     GU PSH: Cystoscopy - 08/01/2023 Sling - 2015 Vaginal Hysterectomy - 2015       PSH Notes: Mid-Urethral Sling Operation, Vaginal Hysterectomy, Dilation Of Female Urethra, Liposuction, Abdominoplasty, Breast Surgery Enlargement Procedure Elective   NON-GU PSH: Breast Reduction - 2015 EXC SKIN ABD ADD-ON - 2015 Thyroidectomy - about 11/25/2022, 11/10/2022 Visit Complexity (formerly GPC1X) - 09/05/2023     GU PMH: Low back pain - 09/29/2023, This is more consistent with musculoskeletal pain I will get the CT films for review. , - 02/10/2023 Suprapubic pain - 09/29/2023, She has a small capacity bladder with pain and sensory urgency. I have give her an IC diet sheet as well as Gemtesa samples. she will return in a month. with the small bladder capacity and pain she might benefit from neuromodulation or possibly HOD. I also mentioned anesthetic cocktails. , - 08/01/2023, She has pelvic pain and frequency with a possible UTI. I reviewed her culture from [place] and sent bactrim  to replace the Nitrofurantoin. I will repeat the culture today as well. She will return in 2-3 weeks. , - 2021 Urinary Frequency - 09/29/2023, - 08/01/2023, - 2021, - 2021 Urinary Urgency - 09/29/2023, - 08/01/2023, - 2021, - 2021 Acute Cystitis/UTI - 09/05/2023, Her UA today has a few WBC and RBC's with bacteria. I will get a culture and treat if positive. With the microhematuria, I will have her return  for cystoscopy. , - 02/10/2023, - 2021 Mixed incontinence - 09/05/2023, - 02/10/2023 (Worsening), - 2017, Urge and stress incontinence, - 2016 Cystocele, midline - 02/10/2023, Cystocele, midline, - 2015 Postmenopausal atrophic vaginitis, She has mild atrophy but with the recurrent UTI's, I will try her on topical estrogen. Script sent and side effects reviewed. - 02/10/2023 Renal calculus, 1-81mm on CT. I will get the films. 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, Intrinsic sphincter deficiency - 2015 History of urolithiasis, History of renal calculi - 2015 Personal Hx Urinary Tract Infections, History of urinary tract infection - 2015 Chronic Kidney Disease    NON-GU PMH: Constipation, unspecified - 2018 Muscle weakness (generalized) - 2018, - 2018 Other lack of coordination - 2018 Other muscle spasm - 2018 Encounter for general adult medical examination without abnormal findings, Encounter  for preventive health examination - 2016 Personal history of other endocrine, nutritional and metabolic disease, History of hyperthyroidism - 2016 Anxiety, Anxiety - 2015 Personal history of other diseases of the musculoskeletal system and connective tissue, History of arthritis - 2015 Arthritis Hypercholesterolemia    FAMILY HISTORY: Hypertension - Runs In Family No pertinent family history - Other   SOCIAL HISTORY: Marital Status: Single Preferred Language: English; Ethnicity: Not Hispanic Or Latino; Race: White Current Smoking Status: Patient has never smoked.  Does not use smokeless tobacco. Does not use drugs. Drinks 3 caffeinated drinks per day.     Notes: Occupation, Divorced, Caffeine use, Never a smoker, Alcohol use   REVIEW OF SYSTEMS:    GU Review Female:   Patient reports frequent urination, get up at night to urinate, and leakage of urine. Patient denies hard to postpone  urination, burning /pain with urination, stream starts and stops, trouble starting your stream, have to strain to urinate, and being pregnant.  Gastrointestinal (Upper):   Patient denies nausea, vomiting, and indigestion/ heartburn.  Gastrointestinal (Lower):   Patient denies diarrhea and constipation.  Constitutional:   Patient denies fever, night sweats, weight loss, and fatigue.  Skin:   Patient denies skin rash/ lesion and itching.  Eyes:   Patient denies blurred vision and double vision.  Ears/ Nose/ Throat:   Patient denies sore throat and sinus problems.  Hematologic/Lymphatic:   Patient denies swollen glands and easy bruising.  Cardiovascular:   Patient denies leg swelling and chest pains.  Respiratory:   Patient denies cough and shortness of breath.  Endocrine:   Patient denies excessive thirst.  Musculoskeletal:   Patient denies back pain and joint pain.  Neurological:   Patient denies headaches and dizziness.  Psychologic:   Patient denies depression and anxiety.   VITAL SIGNS:      10/04/2023 01:18 PM  Weight 155 lb / 70.31 kg  Height 64 in / 162.56 cm  BP 116/71 mmHg  Heart Rate 101 /min  Temperature 98.2 F / 36.7 C  BMI 26.6 kg/m   MULTI-SYSTEM PHYSICAL EXAMINATION:    Constitutional: Well-nourished. No physical deformities. Normally developed. Good grooming.  Cardiovascular: Normal temperature, normal extremity pulses, no swelling, no varicosities.  Skin: No paleness, no jaundice, no cyanosis. No lesion, no ulcer, no rash.  Neurologic / Psychiatric: Oriented to time, oriented to place, oriented to person. No depression, no anxiety, no agitation.  Gastrointestinal: No mass, no tenderness, no rigidity, non obese abdomen.     Complexity of Data:  Source Of History:  Patient  Records Review:   Previous Doctor Records, Previous Patient Records  Urine Test Review:   Urinalysis  X-Ray Review: C.T. Abdomen/Pelvis: Reviewed Films. Reviewed Report. Discussed With Patient.      10/04/23  Urinalysis  Urine Appearance Clear   Urine Color Yellow   Urine Glucose Neg mg/dL  Urine Bilirubin Neg mg/dL  Urine Ketones Neg mg/dL  Urine Specific Gravity 1.020   Urine Blood Neg ery/uL  Urine pH <=5.0   Urine Protein Neg mg/dL  Urine Urobilinogen 0.2 mg/dL  Urine Nitrites Neg   Urine Leukocyte Esterase Neg leu/uL   PROCEDURES:          Visit Complexity - G2211          Urinalysis Dipstick Dipstick Cont'd  Color: Yellow Bilirubin: Neg mg/dL  Appearance: Clear Ketones: Neg mg/dL  Specific Gravity: 8.979 Blood: Neg ery/uL  pH: <=5.0 Protein: Neg mg/dL  Glucose: Neg mg/dL Urobilinogen: 0.2 mg/dL  Nitrites: Neg    Leukocyte Esterase: Neg leu/uL    ASSESSMENT:      ICD-10 Details  1 GU:   Suprapubic pain - R39.82 Chronic, Stable  2   Urinary Frequency - R35.0 Chronic, Stable  3   Urinary Urgency - R39.15 Chronic, Stable   PLAN:            Medications Stop Meds: Topamax 1 PO Daily  Discontinue: 10/04/2023  - Reason: The medication cycle was completed.            Orders         Schedule X-Rays: 1 Year - MRI Abdomen With and Without I.V. Contrast  Return Visit/Planned Activity: Next Available Appointment Refer to physician - Belvie BIRCH. Rollin, MD             Note: please refer for mesenteric panniculitis  Return Visit/Planned Activity: Keep Scheduled Appointment          Document Letter(s):  Created for Patient: Clinical Summary         Notes:   I discussed CT findings with her and advised follow-up with GI for the liver lesion as well as the mesenteric panniculitis. I will send a referral in today. She is going to keep her upcoming surgery is scheduled for hydrodistention on 11/03/2023. She will need a MRI or CT renal protocol for the adrenal adenoma found incidentally today.        Next Appointment:      Next Appointment: 11/03/2023 09:00 AM    Appointment Type: Surgery     Location: [facility] [phone]    Provider:  Norleen Seltzer, M.D.    Reason for Visit: OP--[facility]--CYSTO/HOD

## 2023-11-03 ENCOUNTER — Ambulatory Visit (HOSPITAL_COMMUNITY)
Admission: RE | Admit: 2023-11-03 | Discharge: 2023-11-03 | Disposition: A | Discharge status: Home / Self Care | Attending: Urology | Admitting: Urology

## 2023-11-03 ENCOUNTER — Encounter (HOSPITAL_COMMUNITY)
Admission: RE | Disposition: A | Discharge status: Home / Self Care | Payer: Self-pay | Source: Home / Self Care | Attending: Urology

## 2023-11-03 ENCOUNTER — Ambulatory Visit (HOSPITAL_BASED_OUTPATIENT_CLINIC_OR_DEPARTMENT_OTHER)

## 2023-11-03 ENCOUNTER — Encounter (HOSPITAL_COMMUNITY): Payer: Self-pay | Admitting: Urology

## 2023-11-03 ENCOUNTER — Ambulatory Visit (HOSPITAL_COMMUNITY): Payer: Self-pay | Admitting: Physician Assistant

## 2023-11-03 DIAGNOSIS — N3010 Interstitial cystitis (chronic) without hematuria: Secondary | ICD-10-CM

## 2023-11-03 DIAGNOSIS — E039 Hypothyroidism, unspecified: Secondary | ICD-10-CM | POA: Diagnosis not present

## 2023-11-03 DIAGNOSIS — Z01818 Encounter for other preprocedural examination: Secondary | ICD-10-CM

## 2023-11-03 DIAGNOSIS — Z90710 Acquired absence of both cervix and uterus: Secondary | ICD-10-CM | POA: Diagnosis not present

## 2023-11-03 HISTORY — PX: CYSTO WITH HYDRODISTENSION: SHX5453

## 2023-11-03 LAB — CBC
HCT: 45.5 % (ref 36.0–46.0)
Hemoglobin: 13.9 g/dL (ref 12.0–15.0)
MCH: 29.1 pg (ref 26.0–34.0)
MCHC: 30.5 g/dL (ref 30.0–36.0)
MCV: 95.2 fL (ref 80.0–100.0)
Platelets: 217 K/uL (ref 150–400)
RBC: 4.78 MIL/uL (ref 3.87–5.11)
RDW: 12.8 % (ref 11.5–15.5)
WBC: 5.2 K/uL (ref 4.0–10.5)
nRBC: 0 % (ref 0.0–0.2)

## 2023-11-03 LAB — BASIC METABOLIC PANEL WITH GFR
Anion gap: 7 (ref 5–15)
BUN: 15 mg/dL (ref 6–20)
CO2: 29 mmol/L (ref 22–32)
Calcium: 8.7 mg/dL — ABNORMAL LOW (ref 8.9–10.3)
Chloride: 105 mmol/L (ref 98–111)
Creatinine, Ser: 0.72 mg/dL (ref 0.44–1.00)
GFR, Estimated: >60 mL/min (ref >60)
Glucose, Bld: 90 mg/dL (ref 70–99)
Potassium: 4.4 mmol/L (ref 3.5–5.1)
Sodium: 141 mmol/L (ref 135–145)

## 2023-11-03 SURGERY — CYSTOSCOPY, WITH BLADDER HYDRODISTENSION
Anesthesia: General | Site: Bladder

## 2023-11-03 MED ORDER — FLUCONAZOLE 150 MG PO TABS: ORAL_TABLET | ORAL | 0 refills | Status: DC

## 2023-11-03 MED ORDER — LACTATED RINGERS IV SOLN: INTRAVENOUS | Status: DC

## 2023-11-03 MED ORDER — FLUCONAZOLE 150 MG PO TABS: ORAL_TABLET | ORAL | 0 refills | Status: AC

## 2023-11-03 MED ORDER — CHLORHEXIDINE GLUCONATE 0.12 % MT SOLN
15.0000 mL | Freq: Once | OROMUCOSAL | Status: AC
  Administered 2023-11-03: 15 mL via OROMUCOSAL

## 2023-11-03 MED ORDER — PROPOFOL 10 MG/ML IV BOLUS
INTRAVENOUS | Status: DC | PRN
  Administered 2023-11-03 (×2): 100 mg via INTRAVENOUS

## 2023-11-03 MED ORDER — DEXAMETHASONE SODIUM PHOSPHATE 4 MG/ML IJ SOLN
INTRAMUSCULAR | Status: DC | PRN
  Administered 2023-11-03: 5 mg via INTRAVENOUS

## 2023-11-03 MED ORDER — ONDANSETRON HCL 4 MG/2ML IJ SOLN
INTRAMUSCULAR | Status: DC | PRN
  Administered 2023-11-03: 4 mg via INTRAVENOUS

## 2023-11-03 MED ORDER — FENTANYL CITRATE (PF) 100 MCG/2ML IJ SOLN
INTRAMUSCULAR | Status: DC | PRN
  Administered 2023-11-03 (×4): 25 ug via INTRAVENOUS

## 2023-11-03 MED ORDER — SODIUM CHLORIDE 0.9% FLUSH: 3.0000 mL | Freq: Two times a day (BID) | INTRAVENOUS | Status: DC

## 2023-11-03 MED ORDER — STERILE WATER FOR IRRIGATION IR SOLN
Status: DC | PRN
  Administered 2023-11-03: 3000 mL

## 2023-11-03 MED ORDER — PROPOFOL 10 MG/ML IV BOLUS
INTRAVENOUS | Status: AC
  Filled 2023-11-03: qty 20

## 2023-11-03 MED ORDER — BUPIVACAINE HCL (PF) 0.5 % IJ SOLN
INTRAMUSCULAR | Status: AC
  Filled 2023-11-03: qty 30

## 2023-11-03 MED ORDER — MIDAZOLAM HCL 2 MG/2ML IJ SOLN
INTRAMUSCULAR | Status: AC
  Filled 2023-11-03: qty 2

## 2023-11-03 MED ORDER — MIDAZOLAM HCL 5 MG/5ML IJ SOLN
INTRAMUSCULAR | Status: DC | PRN
  Administered 2023-11-03: 2 mg via INTRAVENOUS

## 2023-11-03 MED ORDER — OXYCODONE HCL 5 MG PO TABS: 5.0000 mg | ORAL_TABLET | Freq: Four times a day (QID) | ORAL | 0 refills | Status: DC | PRN

## 2023-11-03 MED ORDER — OXYCODONE HCL 5 MG PO TABS: 5.0000 mg | ORAL_TABLET | Freq: Four times a day (QID) | ORAL | 0 refills | Status: AC | PRN

## 2023-11-03 MED ORDER — CEFAZOLIN SODIUM-DEXTROSE 2-4 GM/100ML-% IV SOLN
2.0000 g | INTRAVENOUS | Status: AC
Start: 2023-11-03 — End: 2023-11-03
  Administered 2023-11-03: 2 g via INTRAVENOUS
  Filled 2023-11-03: qty 100

## 2023-11-03 MED ORDER — LIDOCAINE HCL (PF) 2 % IJ SOLN
INTRAMUSCULAR | Status: AC
Start: 2023-11-03 — End: 2023-11-03
  Filled 2023-11-03: qty 5

## 2023-11-03 MED ORDER — ORAL CARE MOUTH RINSE: 15.0000 mL | Freq: Once | OROMUCOSAL | Status: AC

## 2023-11-03 MED ORDER — LIDOCAINE 2% (20 MG/ML) 5 ML SYRINGE
INTRAMUSCULAR | Status: DC | PRN
  Administered 2023-11-03: 100 mg via INTRAVENOUS

## 2023-11-03 MED ORDER — ONDANSETRON HCL 4 MG/2ML IJ SOLN
INTRAMUSCULAR | Status: AC
  Filled 2023-11-03: qty 2

## 2023-11-03 MED ORDER — BUPIVACAINE HCL 0.5 % IJ SOLN
INTRAMUSCULAR | Status: DC | PRN
  Administered 2023-11-03: 15 mL

## 2023-11-03 MED ORDER — FENTANYL CITRATE (PF) 100 MCG/2ML IJ SOLN
INTRAMUSCULAR | Status: AC
  Filled 2023-11-03: qty 2

## 2023-11-03 SURGICAL SUPPLY — 17 items
BAG URO CATCHER STRL LF (MISCELLANEOUS) ×1 IMPLANT
CATH FOLEY 2WAY 5CC 20FR (CATHETERS) ×1 IMPLANT
CATH ROBINSON RED A/P 16FR (CATHETERS) ×1 IMPLANT
CATH ROBINSON RED A/P 18FR (CATHETERS) IMPLANT
CATH URETL OPEN 5X70 (CATHETERS) IMPLANT
GLOVE SURG SS PI 8.0 STRL IVOR (GLOVE) IMPLANT
GOWN STRL SURGICAL XL XLNG (GOWN DISPOSABLE) ×1 IMPLANT
KIT TURNOVER KIT A (KITS) ×1 IMPLANT
MANIFOLD NEPTUNE II (INSTRUMENTS) ×1 IMPLANT
NDL HYPO 22X1.5 SAFETY MO (MISCELLANEOUS) IMPLANT
NDL SAFETY ECLIPSE 18X1.5 (NEEDLE) IMPLANT
NEEDLE HYPO 22X1.5 SAFETY MO (MISCELLANEOUS) IMPLANT
PACK CYSTO (CUSTOM PROCEDURE TRAY) ×1 IMPLANT
PLUG CATH AND CAP STRL 200 (CATHETERS) ×1 IMPLANT
SYR BULB IRRIG 60ML STRL (SYRINGE) IMPLANT
TUBING CONNECTING 10 (TUBING) IMPLANT
WATER STERILE IRR 1000ML POUR (IV SOLUTION) IMPLANT

## 2023-11-03 NOTE — Op Note (Signed)
 Procedure: Cystoscopy with hydrodistention of the bladder and instillation of Marcaine .  Pre-op diagnosis: Interstitial cystitis.  Postop diagnosis: Same.  Surgeon: Dr. Norleen Seltzer.  Anesthesia: General.  Specimen: None.  Drains: None.  EBL: None.  Complications: None.  Indications: The patient is a 53 year old female with long history of interstitial cystitis who is requested hydrodistention of the bladder for symptom relief.  Procedure: She was taken the operating room where general anesthetic was induced.  She was placed in lithotomy position and fitted with PAS hose.  Her perineum and genitalia were prepped with Betadine solution she was draped in usual sterile fashion.  Cystoscopy was performed using a 21 [place] scope and 30 degree lens.  Examination revealed a normal urethra.  The bladder wall was smooth and pale without tumors, stones or inflammation.  Ureteral orifices were unremarkable.  The bladder was then distended to capacity at 80 cm of water  pressure and then drained after 60 seconds.  Her capacity under anesthesia was 900 mL.  The terminal reflux was bloody.  Inspection of the bladder following hydrodistention demonstrated glomerulations of the trigone and lateral walls consistent with her diagnosis of interstitial cystitis.  The cystoscope was removed after the bladder was drained and a 14 French red rubber catheter was used to instill 15 mL of 0.5% Marcaine .  The catheter was removed.  She was taken down from lithotomy position, her anesthetic was reversed and she was moved to recovery in stable condition.  There were no complications.

## 2023-11-03 NOTE — Discharge Instructions (Signed)
 CYSTOSCOPY HOME CARE INSTRUCTIONS  Activity: Rest for the remainder of the day.  Do not drive or operate equipment today.  You may resume normal activities in one to two days as instructed by your physician.   Meals: Drink plenty of liquids and eat light foods such as gelatin or soup this evening.  You may return to a normal meal plan tomorrow.  Return to Work: You may return to work in one to two days or as instructed by your physician.  Special Instructions / Symptoms: Call your physician if any of these symptoms occur:   -persistent or heavy bleeding  -bleeding which continues after first few urination  -large blood clots that are difficult to pass  -urine stream diminishes or stops completely  -fever equal to or higher than 101 degrees Farenheit.  -cloudy urine with a strong, foul odor  -severe pain  Females should always wipe from front to back after elimination.  You may feel some burning pain when you urinate.  This should disappear with time.  Applying moist heat to the lower abdomen or a hot tub bath may help relieve the pain. \   Patient Signature:  ________________________________________________________  Nurse's Signature:  ________________________________________________________

## 2023-11-03 NOTE — Anesthesia Postprocedure Evaluation (Signed)
 Anesthesia Post Note  Patient: Dawn Rojas  Procedure(s) Performed: CYSTOSCOPY, WITH BLADDER HYDRODISTENSION (Bladder)     Patient location during evaluation: PACU Anesthesia Type: General Level of consciousness: awake Pain management: pain level controlled Vital Signs Assessment: post-procedure vital signs reviewed and stable Respiratory status: spontaneous breathing Cardiovascular status: blood pressure returned to baseline Postop Assessment: no apparent nausea or vomiting Anesthetic complications: no   No notable events documented.  Last Vitals:  Vitals:   11/03/23 1000 11/03/23 1030  BP: (!) 149/95 (!) 124/93  Pulse: 64 65  Resp: 17   Temp:    SpO2: 93% 95%    Last Pain:  Vitals:   11/03/23 1030  TempSrc:   PainSc: 0-No pain                 Lauraine KATHEE [place]

## 2023-11-03 NOTE — Transfer of Care (Signed)
 Immediate Anesthesia Transfer of Care Note  Patient: Dawn Rojas  Procedure(s) Performed: CYSTOSCOPY, WITH BLADDER HYDRODISTENSION (Bladder)  Patient Location: PACU  Anesthesia Type:General  Level of Consciousness: awake  Airway & Oxygen Therapy: Patient Spontanous Breathing and Patient connected to face mask oxygen  Post-op Assessment: Report given to RN and Post -op Vital signs reviewed and stable  Post vital signs: Reviewed and stable  Last Vitals:  Vitals Value Taken Time  BP 137/85 11/03/23 09:24  Temp    Pulse 85 11/03/23 09:25  Resp 15 11/03/23 09:25  SpO2 98 % 11/03/23 09:25  Vitals shown include unfiled device data.  Last Pain:  Vitals:   11/03/23 0750  TempSrc:   PainSc: 0-No pain         Complications: No notable events documented.

## 2023-11-03 NOTE — Anesthesia Preprocedure Evaluation (Addendum)
 Anesthesia Evaluation  Patient identified by MRN, date of birth, ID band Patient awake    Reviewed: Allergy & Precautions, NPO status , Patient's Chart, lab work & pertinent test results  History of Anesthesia Complications Negative for: history of anesthetic complications  Airway Mallampati: II  TM Distance: >3 FB Neck ROM: Full    Dental no notable dental hx. (+) Teeth Intact, Dental Advisory Given   Pulmonary    breath sounds clear to auscultation       Cardiovascular (-) hypertension Rhythm:Regular Rate:Normal     Neuro/Psych  Headaches    GI/Hepatic   Endo/Other  Hypothyroidism    Renal/GU  Bladder dysfunction      Musculoskeletal  (+) Arthritis ,    Abdominal   Peds  Hematology   Anesthesia Other Findings   Reproductive/Obstetrics                              Anesthesia Physical Anesthesia Plan  ASA: 2  Anesthesia Plan: General   Post-op Pain Management:    Induction: Intravenous  PONV Risk Score and Plan:   Airway Management Planned: LMA  Additional Equipment:   Intra-op Plan:   Post-operative Plan: Extubation in OR  Informed Consent:      Dental advisory given  Plan Discussed with: Surgeon and CRNA  Anesthesia Plan Comments:          Anesthesia Quick Evaluation

## 2023-11-03 NOTE — Anesthesia Procedure Notes (Signed)
 Procedure Name: LMA Insertion Date/Time: 11/03/2023 8:54 AM  Performed by: Pam Macario BROCKS, CRNAPre-anesthesia Checklist: Patient identified, Emergency Drugs available, Suction available, Patient being monitored and Timeout performed Patient Re-evaluated:Patient Re-evaluated prior to induction Oxygen Delivery Method: Circle system utilized Preoxygenation: Pre-oxygenation with 100% oxygen Induction Type: IV induction Ventilation: Mask ventilation without difficulty LMA: LMA inserted LMA Size: 4.0 Number of attempts: 1 Airway Equipment and Method: Bite block Placement Confirmation: positive ETCO2, breath sounds checked- equal and bilateral and CO2 detector Tube secured with: Tape Dental Injury: Teeth and Oropharynx as per pre-operative assessment

## 2023-11-03 NOTE — Interval H&P Note (Signed)
 History and Physical Interval Note:  She has requested diflucan for post op since she is getting an antibiotic.   11/03/2023 8:28 AM  Dawn Rojas  has presented today for surgery, with the diagnosis of INTERSTITIAL CYSTITIS.  The various methods of treatment have been discussed with the patient and family. After consideration of risks, benefits and other options for treatment, the patient has consented to  Procedure(s): CYSTOSCOPY, WITH BLADDER HYDRODISTENSION (N/A) as a surgical intervention.  The patient's history has been reviewed, patient examined, no change in status, stable for surgery.  I have reviewed the patient's chart and labs.  Questions were answered to the patient's satisfaction.     [name]

## 2023-11-04 ENCOUNTER — Encounter (HOSPITAL_COMMUNITY): Payer: Self-pay | Admitting: Urology
